# Patient Record
Sex: Female | Born: 1960 | Hispanic: Yes | Marital: Single | Smoking: Never smoker
Health system: Southern US, Community
[De-identification: ages and names within clinical notes are randomized; demographics above are authoritative.]

## PROBLEM LIST (undated history)

## (undated) DIAGNOSIS — D649 Anemia, unspecified: Secondary | ICD-10-CM

## (undated) HISTORY — PX: CHOLECYSTECTOMY: SHX55

## (undated) HISTORY — DX: Anemia, unspecified: D64.9

---

## 2011-02-11 ENCOUNTER — Other Ambulatory Visit: Payer: Self-pay | Admitting: Geriatric Medicine

## 2011-02-11 DIAGNOSIS — R102 Pelvic and perineal pain: Secondary | ICD-10-CM

## 2011-02-11 DIAGNOSIS — N83209 Unspecified ovarian cyst, unspecified side: Secondary | ICD-10-CM

## 2011-02-12 ENCOUNTER — Ambulatory Visit
Admission: RE | Admit: 2011-02-12 | Discharge: 2011-02-12 | Disposition: A | Discharge status: Home / Self Care | Payer: No Typology Code available for payment source | Source: Ambulatory Visit | Attending: Geriatric Medicine | Admitting: Geriatric Medicine

## 2011-02-12 DIAGNOSIS — R102 Pelvic and perineal pain: Secondary | ICD-10-CM

## 2011-02-12 DIAGNOSIS — N83209 Unspecified ovarian cyst, unspecified side: Secondary | ICD-10-CM

## 2011-02-21 ENCOUNTER — Ambulatory Visit: Payer: No Typology Code available for payment source | Admitting: Gynecology

## 2011-03-10 ENCOUNTER — Ambulatory Visit (INDEPENDENT_AMBULATORY_CARE_PROVIDER_SITE_OTHER): Payer: Self-pay | Admitting: Gynecology

## 2011-03-10 DIAGNOSIS — N949 Unspecified condition associated with female genital organs and menstrual cycle: Secondary | ICD-10-CM

## 2011-03-10 DIAGNOSIS — R1900 Intra-abdominal and pelvic swelling, mass and lump, unspecified site: Secondary | ICD-10-CM

## 2011-03-10 DIAGNOSIS — N83209 Unspecified ovarian cyst, unspecified side: Secondary | ICD-10-CM

## 2011-03-12 ENCOUNTER — Other Ambulatory Visit: Payer: Self-pay | Admitting: Gynecology

## 2011-03-12 DIAGNOSIS — R1900 Intra-abdominal and pelvic swelling, mass and lump, unspecified site: Secondary | ICD-10-CM

## 2011-03-19 ENCOUNTER — Ambulatory Visit
Admission: RE | Admit: 2011-03-19 | Discharge: 2011-03-19 | Disposition: A | Discharge status: Home / Self Care | Payer: No Typology Code available for payment source | Source: Ambulatory Visit | Attending: Gynecology | Admitting: Gynecology

## 2011-03-19 ENCOUNTER — Other Ambulatory Visit: Payer: Self-pay

## 2011-03-19 DIAGNOSIS — R1900 Intra-abdominal and pelvic swelling, mass and lump, unspecified site: Secondary | ICD-10-CM

## 2011-03-19 MED ORDER — GADOBENATE DIMEGLUMINE 529 MG/ML IV SOLN
15.0000 mL | Freq: Once | INTRAVENOUS | Status: AC | PRN
  Administered 2011-03-19: 15 mL via INTRAVENOUS

## 2011-03-24 ENCOUNTER — Encounter (HOSPITAL_COMMUNITY)
Admission: RE | Admit: 2011-03-24 | Discharge: 2011-03-24 | Disposition: A | Discharge status: Home / Self Care | Payer: Self-pay | Source: Ambulatory Visit | Attending: Gynecology | Admitting: Gynecology

## 2011-03-24 ENCOUNTER — Inpatient Hospital Stay (HOSPITAL_COMMUNITY): Admission: RE | Admit: 2011-03-24 | Payer: Self-pay | Source: Ambulatory Visit

## 2011-03-24 ENCOUNTER — Institutional Professional Consult (permissible substitution) (INDEPENDENT_AMBULATORY_CARE_PROVIDER_SITE_OTHER): Payer: Self-pay | Admitting: Gynecology

## 2011-03-24 ENCOUNTER — Encounter (HOSPITAL_COMMUNITY): Payer: Self-pay

## 2011-03-24 DIAGNOSIS — Z01818 Encounter for other preprocedural examination: Secondary | ICD-10-CM | POA: Insufficient documentation

## 2011-03-24 DIAGNOSIS — Z01812 Encounter for preprocedural laboratory examination: Secondary | ICD-10-CM | POA: Insufficient documentation

## 2011-03-24 DIAGNOSIS — R1900 Intra-abdominal and pelvic swelling, mass and lump, unspecified site: Secondary | ICD-10-CM

## 2011-03-24 LAB — COMPREHENSIVE METABOLIC PANEL
BUN: 11 mg/dL (ref 6–23)
CO2: 27 mEq/L (ref 19–32)
Calcium: 9.3 mg/dL (ref 8.4–10.5)
Creatinine, Ser: 0.56 mg/dL (ref 0.50–1.10)
GFR calc Af Amer: >60 mL/min (ref >60)
GFR calc non Af Amer: >60 mL/min (ref >60)
Glucose, Bld: 92 mg/dL (ref 70–99)

## 2011-03-24 LAB — URINALYSIS, ROUTINE W REFLEX MICROSCOPIC
Ketones, ur: NEGATIVE mg/dL
Leukocytes, UA: NEGATIVE
Nitrite: NEGATIVE
Protein, ur: NEGATIVE mg/dL

## 2011-03-24 LAB — CBC
HCT: 32.2 % — ABNORMAL LOW (ref 36.0–46.0)
Hemoglobin: 10.3 g/dL — ABNORMAL LOW (ref 12.0–15.0)
MCV: 62.2 fL — ABNORMAL LOW (ref 78.0–100.0)
RDW: 14.9 % (ref 11.5–15.5)
WBC: 8.4 10*3/uL (ref 4.0–10.5)

## 2011-03-24 LAB — SURGICAL PCR SCREEN: Staphylococcus aureus: NEGATIVE

## 2011-03-24 NOTE — Patient Instructions (Signed)
20 Margarete Hobday  03/24/2011   Your procedure is scheduled on:  03/31/11  Report to [facility] at 1115 AM.  Call this number if you have problems the morning of surgery: [phone]   Remember:   Do not eat food:After Midnight.  Do not drink clear liquids: 4 Hours before arrival.  Take these medicines the morning of surgery with A SIP OF WATERnone   Do not wear jewelry, make-up or nail polish.  Do not bring valuables to the hospital.  Contacts, dentures or bridgework may not be worn into surgery.  Leave suitcase in the car. After surgery it may be brought to your room.  For patients admitted to the hospital, checkout time is 11:00 AM the day of discharge.   Patients discharged the day of surgery will not be allowed to drive home.  Name and phone number of your driver: [phone]  Special Instructions: CHG Shower Shower 2 days before surgery and 1 day before surgery with Hibiclens.   Please read over the following fact sheets that you were given: Pain Booklet and Surgical Site Infection Prevention

## 2011-03-28 ENCOUNTER — Encounter (HOSPITAL_COMMUNITY): Payer: Self-pay | Admitting: Gynecology

## 2011-03-28 DIAGNOSIS — R1900 Intra-abdominal and pelvic swelling, mass and lump, unspecified site: Secondary | ICD-10-CM | POA: Diagnosis present

## 2011-03-28 NOTE — H&P (Addendum)
Kathleen Watson is an 50 y.o. female who had been referred to our office at [facility] from the Urgent Care facility as a result of abdominal pelvic pains. An ultrasound had demonstrated an anechoic cystic mass with no blood flow with a dimension of 12 .4x8.4x10.4 cm. CT scan confirmed. No pelvic lymphadenopathy and chest x-ray normal. Her CA 125 was normal as well.  Pertinent Gynecological History: Menses: post-menopausal Bleeding: none Contraception: none DES exposure: unknown Blood transfusions: none Sexually transmitted diseases: no past history Previous GYN Procedures: 3 cesections  Last mammogram: normal Date: unknown Last pap: normal Date: menopausal OB History:G3, P3   Menstrual History: Menarche age: 4 No LMP recorded.    Past Medical History  Diagnosis Date  . GERD (gastroesophageal reflux disease)     mild, occasional - no meds    Past Surgical History  Procedure Date  . Cesarean section     times three  . Cholecystectomy     History reviewed. No pertinent family history.  Social History:  reports that she has never smoked. She does not have any smokeless tobacco history on file. She reports that she does not drink alcohol or use illicit drugs.  Allergies: No Known Allergies  No prescriptions prior to admission    Review of Systems  Constitutional: Negative.   HENT: Negative.   Eyes: Negative.   Respiratory: Negative.   Cardiovascular: Negative.   Gastrointestinal: Positive for abdominal pain.  Genitourinary: Negative.   Musculoskeletal: Negative.   Skin: Negative.   Neurological: Negative.   Endo/Heme/Allergies: Negative.   Psychiatric/Behavioral: Negative.     There were no vitals taken for this visit. Physical Exam  Constitutional: She is oriented to person, place, and time. She appears well-developed and well-nourished.  HENT:  Head: Normocephalic and atraumatic.  Right Ear: External ear normal.  Left Ear: External ear normal.    Nose: Nose normal.  Mouth/Throat: Oropharynx is clear and moist.  Eyes: Conjunctivae and EOM are normal. Pupils are equal, round, and reactive to light.  Neck: Normal range of motion. Neck supple.  Cardiovascular: Normal rate and regular rhythm.   Respiratory: Effort normal and breath sounds normal.  GI: She exhibits mass. There is tenderness.       Mass can be felt up to 2 fingerbreadths underneath the umbilicus  Genitourinary:       On bimanual exam the mass appeared to occupy the abdomen and centrally located just to a few fingerbreadths from the umbilicus  Musculoskeletal: Normal range of motion.  Neurological: She is alert and oriented to person, place, and time. She has normal reflexes.  Skin: Skin is warm.  Psychiatric: She has a normal mood and affect.    No results found for this or any previous visit (from the past 24 hour(s)).  No results found.  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 Patient scheduled for an exploratory laparotomy with bilateral salping oophorectomy. Risk and benefits were discussed with the patient in spanish to include: infection (prohylactic antibiotics will be administered), trauma to internal organs (requiring corrective surgery), DVT (Pneumonocompression stockings will be used prophylacticaly), hemorrhage (risk of blood transfusion with its risk of AIDS,Hepatitis and anaphylactic reaction from donor blood and blood products). Patient  Also had been provided with literature on pelvic mass. Patient fully understands and all questions answered.  Ok Edwards 03/28/2011, 3:35 PM

## 2011-03-30 MED ORDER — DEXTROSE 5 % IV SOLN
1.0000 g | Freq: Once | INTRAVENOUS | Status: DC
  Filled 2011-03-30: qty 1

## 2011-03-31 ENCOUNTER — Inpatient Hospital Stay (HOSPITAL_COMMUNITY)
Admission: RE | Admit: 2011-03-31 | Discharge: 2011-04-02 | DRG: 743 | Disposition: A | Discharge status: Home / Self Care | Payer: Self-pay | Source: Ambulatory Visit | Attending: Gynecology | Admitting: Gynecology

## 2011-03-31 ENCOUNTER — Encounter (HOSPITAL_COMMUNITY): Payer: Self-pay | Admitting: Gynecology

## 2011-03-31 ENCOUNTER — Other Ambulatory Visit: Payer: Self-pay | Admitting: Gynecology

## 2011-03-31 ENCOUNTER — Encounter (HOSPITAL_COMMUNITY)
Admission: RE | Disposition: A | Discharge status: Home / Self Care | Payer: Self-pay | Source: Ambulatory Visit | Attending: Gynecology

## 2011-03-31 ENCOUNTER — Encounter (HOSPITAL_COMMUNITY): Payer: Self-pay | Admitting: Anesthesiology

## 2011-03-31 ENCOUNTER — Ambulatory Visit (HOSPITAL_COMMUNITY): Payer: Self-pay | Admitting: Anesthesiology

## 2011-03-31 ENCOUNTER — Ambulatory Visit (HOSPITAL_COMMUNITY)
Admission: RE | Admit: 2011-03-31 | Discharge: 2011-03-31 | Disposition: A | Discharge status: Home / Self Care | Payer: Self-pay | Source: Ambulatory Visit | Attending: Gynecology | Admitting: Gynecology

## 2011-03-31 DIAGNOSIS — D279 Benign neoplasm of unspecified ovary: Principal | ICD-10-CM | POA: Diagnosis present

## 2011-03-31 DIAGNOSIS — R1900 Intra-abdominal and pelvic swelling, mass and lump, unspecified site: Secondary | ICD-10-CM

## 2011-03-31 DIAGNOSIS — N83209 Unspecified ovarian cyst, unspecified side: Secondary | ICD-10-CM

## 2011-03-31 DIAGNOSIS — D649 Anemia, unspecified: Secondary | ICD-10-CM | POA: Diagnosis present

## 2011-03-31 DIAGNOSIS — N949 Unspecified condition associated with female genital organs and menstrual cycle: Secondary | ICD-10-CM

## 2011-03-31 HISTORY — PX: SALPINGOOPHORECTOMY: SHX82

## 2011-03-31 HISTORY — PX: LAPAROTOMY: SHX154

## 2011-03-31 HISTORY — PX: BILATERAL SALPINGOOPHORECTOMY: SHX1223

## 2011-03-31 SURGERY — LAPAROTOMY
Anesthesia: General | Site: Abdomen | Wound class: Clean Contaminated

## 2011-03-31 MED ORDER — BUPIVACAINE HCL 0.25 % IJ SOLN
INTRAMUSCULAR | Status: DC | PRN
  Administered 2011-03-31: 10 mL

## 2011-03-31 MED ORDER — LACTATED RINGERS IV SOLN
INTRAVENOUS | Status: DC
  Administered 2011-03-31 (×2): via INTRAVENOUS

## 2011-03-31 MED ORDER — MORPHINE SULFATE 10 MG/ML IJ SOLN
INTRAMUSCULAR | Status: AC
  Filled 2011-03-31: qty 1

## 2011-03-31 MED ORDER — ROCURONIUM BROMIDE 50 MG/5ML IV SOLN
INTRAVENOUS | Status: AC
  Filled 2011-03-31: qty 1

## 2011-03-31 MED ORDER — KETOROLAC TROMETHAMINE 30 MG/ML IJ SOLN
INTRAMUSCULAR | Status: DC | PRN
  Administered 2011-03-31: 30 mg via INTRAVENOUS

## 2011-03-31 MED ORDER — DEXAMETHASONE SODIUM PHOSPHATE 4 MG/ML IJ SOLN
INTRAMUSCULAR | Status: DC | PRN
  Administered 2011-03-31: 4 mg via INTRAVENOUS

## 2011-03-31 MED ORDER — SODIUM CHLORIDE 0.9 % IJ SOLN: 9.0000 mL | INTRAMUSCULAR | Status: DC | PRN

## 2011-03-31 MED ORDER — MIDAZOLAM HCL 2 MG/2ML IJ SOLN
INTRAMUSCULAR | Status: AC
  Filled 2011-03-31: qty 2

## 2011-03-31 MED ORDER — GLYCOPYRROLATE 0.2 MG/ML IJ SOLN
INTRAMUSCULAR | Status: DC | PRN
  Administered 2011-03-31: .5 mg via INTRAVENOUS

## 2011-03-31 MED ORDER — NEOSTIGMINE METHYLSULFATE 1 MG/ML IJ SOLN
INTRAMUSCULAR | Status: DC | PRN
  Administered 2011-03-31: 3 mg via INTRAVENOUS

## 2011-03-31 MED ORDER — MIDAZOLAM HCL 5 MG/5ML IJ SOLN
INTRAMUSCULAR | Status: DC | PRN
  Administered 2011-03-31: 2 mg via INTRAVENOUS

## 2011-03-31 MED ORDER — NALOXONE HCL 0.4 MG/ML IJ SOLN: 0.4000 mg | INTRAMUSCULAR | Status: DC | PRN

## 2011-03-31 MED ORDER — PROPOFOL 10 MG/ML IV EMUL
INTRAVENOUS | Status: DC | PRN
  Administered 2011-03-31: 200 mg via INTRAVENOUS
  Administered 2011-03-31: 40 mg via INTRAVENOUS

## 2011-03-31 MED ORDER — LIDOCAINE HCL (CARDIAC) 10 MG/ML IV SOLN
INTRAVENOUS | Status: DC | PRN
  Administered 2011-03-31: 60 mg via INTRAVENOUS

## 2011-03-31 MED ORDER — KETOROLAC TROMETHAMINE 30 MG/ML IJ SOLN
INTRAMUSCULAR | Status: AC
  Filled 2011-03-31: qty 1

## 2011-03-31 MED ORDER — DEXTROSE IN LACTATED RINGERS 5 % IV SOLN
INTRAVENOUS | Status: DC
  Administered 2011-03-31 – 2011-04-01 (×2): via INTRAVENOUS

## 2011-03-31 MED ORDER — MORPHINE SULFATE (PF) 1 MG/ML IV SOLN
INTRAVENOUS | Status: DC
  Administered 2011-03-31 (×2): 25 mg via INTRAVENOUS
  Filled 2011-03-31: qty 25

## 2011-03-31 MED ORDER — DEXTROSE 5 % IV SOLN
1.0000 g | INTRAVENOUS | Status: DC | PRN
  Administered 2011-03-31: 1 g via INTRAVENOUS

## 2011-03-31 MED ORDER — SODIUM CHLORIDE 0.9 % IR SOLN
Status: DC | PRN
  Administered 2011-03-31: 1000 mL

## 2011-03-31 MED ORDER — ROCURONIUM BROMIDE 100 MG/10ML IV SOLN
INTRAVENOUS | Status: DC | PRN
  Administered 2011-03-31: 35 mg via INTRAVENOUS

## 2011-03-31 MED ORDER — MORPHINE SULFATE 10 MG/ML IJ SOLN
INTRAMUSCULAR | Status: DC | PRN
  Administered 2011-03-31 (×2): 5 mg via INTRAVENOUS

## 2011-03-31 MED ORDER — ONDANSETRON HCL 4 MG/2ML IJ SOLN
4.0000 mg | Freq: Four times a day (QID) | INTRAMUSCULAR | Status: DC
  Administered 2011-03-31 – 2011-04-01 (×4): 4 mg via INTRAVENOUS
  Filled 2011-03-31 (×4): qty 2

## 2011-03-31 MED ORDER — FENTANYL CITRATE 0.05 MG/ML IJ SOLN
INTRAMUSCULAR | Status: DC | PRN
  Administered 2011-03-31: 50 ug via INTRAVENOUS
  Administered 2011-03-31: 100 ug via INTRAVENOUS
  Administered 2011-03-31 (×2): 50 ug via INTRAVENOUS

## 2011-03-31 MED ORDER — LACTATED RINGERS IV SOLN
INTRAVENOUS | Status: DC | PRN
  Administered 2011-03-31 (×2): via INTRAVENOUS

## 2011-03-31 MED ORDER — METOCLOPRAMIDE HCL 5 MG/ML IJ SOLN
10.0000 mg | Freq: Once | INTRAMUSCULAR | Status: AC | PRN
  Administered 2011-03-31: 10 mg via INTRAVENOUS

## 2011-03-31 MED ORDER — DIPHENHYDRAMINE HCL 50 MG/ML IJ SOLN: 25.0000 mg | Freq: Four times a day (QID) | INTRAMUSCULAR | Status: DC | PRN

## 2011-03-31 MED ORDER — MEPERIDINE HCL 25 MG/ML IJ SOLN: 6.2500 mg | INTRAMUSCULAR | Status: DC | PRN

## 2011-03-31 MED ORDER — HYDROMORPHONE HCL 1 MG/ML IJ SOLN
0.2500 mg | INTRAMUSCULAR | Status: DC | PRN
  Administered 2011-03-31: 0.25 mg via INTRAVENOUS
  Administered 2011-03-31: 0.5 mg via INTRAVENOUS

## 2011-03-31 MED ORDER — FENTANYL CITRATE 0.05 MG/ML IJ SOLN
INTRAMUSCULAR | Status: AC
  Filled 2011-03-31: qty 5

## 2011-03-31 MED ORDER — METOCLOPRAMIDE HCL 5 MG/ML IJ SOLN: 10.0000 mg | Freq: Four times a day (QID) | INTRAMUSCULAR | Status: DC

## 2011-03-31 MED ORDER — LIDOCAINE HCL (CARDIAC) 20 MG/ML IV SOLN
INTRAVENOUS | Status: AC
  Filled 2011-03-31: qty 5

## 2011-03-31 MED ORDER — ACETAMINOPHEN 325 MG PO TABS: 325.0000 mg | ORAL_TABLET | ORAL | Status: DC | PRN

## 2011-03-31 MED ORDER — HEPARIN SODIUM (PORCINE) 5000 UNIT/ML IJ SOLN
INTRAMUSCULAR | Status: DC | PRN
  Administered 2011-03-31: 5000 [IU]

## 2011-03-31 MED ORDER — ONDANSETRON HCL 4 MG/2ML IJ SOLN
INTRAMUSCULAR | Status: DC | PRN
  Administered 2011-03-31: 4 mg via INTRAMUSCULAR

## 2011-03-31 MED ORDER — PROPOFOL 10 MG/ML IV EMUL
INTRAVENOUS | Status: AC
  Filled 2011-03-31: qty 20

## 2011-03-31 SURGICAL SUPPLY — 12 items
CELLS DAT CNTRL 66122 CELL SVR (MISCELLANEOUS) IMPLANT
CLOTH BEACON ORANGE TIMEOUT ST (SAFETY) ×3 IMPLANT
GLOVE BIOGEL PI IND STRL 8 (GLOVE) ×2 IMPLANT
GLOVE BIOGEL PI INDICATOR 8 (GLOVE) ×1
GLOVE ECLIPSE 7.5 STRL STRAW (GLOVE) ×6 IMPLANT
PACK ABDOMINAL GYN (CUSTOM PROCEDURE TRAY) ×3 IMPLANT
RETRACTOR WND ALEXIS 25 LRG (MISCELLANEOUS) IMPLANT
RTRCTR WOUND ALEXIS 18CM MED (MISCELLANEOUS)
RTRCTR WOUND ALEXIS 25CM LRG (MISCELLANEOUS)
SPONGE LAP 18X18 X RAY DECT (DISPOSABLE) ×6 IMPLANT
SUT VICRYL #0 CTB 1 (SUTURE) ×6 IMPLANT
SUT VICRYL 0 TIES 12 18 (SUTURE) ×3 IMPLANT

## 2011-03-31 NOTE — Anesthesia Postprocedure Evaluation (Signed)
  Anesthesia Post-op Note  Patient: Kathleen Watson  Procedure(s) Performed:  LAPAROTOMY; BILATERAL SALPINGO OOPHORECTOMY  Patient Location: PACU  Anesthesia Type: General  Level of Consciousness: awake, alert  and oriented  Airway and Oxygen Therapy: Patient Spontanous Breathing and Patient connected to nasal cannula oxygen  Post-op Pain: none  Post-op Assessment: Post-op Vital signs reviewed and Patient's Cardiovascular Status Stable  Post-op Vital Signs: Reviewed and stable  Complications: No apparent anesthesia complications

## 2011-03-31 NOTE — H&P (Signed)
No change in patients past medical history in the past 24 hours to the best of my knowledge.

## 2011-03-31 NOTE — Op Note (Signed)
03/31/2011  1:54 PM  PATIENT:  Kathleen Watson  50 y.o. female  PRE-OPERATIVE DIAGNOSIS:  Pelvic mass  POST-OPERATIVE DIAGNOSIS:  same  PROCEDURE:  Procedure(s): LAPAROTOMY BILATERAL SALPINGO OOPHORECTOMY  SURGEON:  Surgeon(s): Ok Edwards, MD Dara Lords, MD  ASSISTANTS: Nadyne Coombes. Fontaine   ANESTHESIA:   general  PROCEDURE:the patient was taken to the operating room where she underwent successful general endotracheal anesthesia. PSA stockings were in place as well as a Foley catheter in an effort to monitor urinary output. The abdomen was prepped and draped in the usual sterile fashion. A Pfannenstiel skin incision was made from the skin down to the subcutaneous tissue. The rectus fascia was incised in transverse fashion. The peritoneal cavity was entered cautiously. The O'Connor-O'Sullivan retractors were then placed. Patient was placed in Trendelenburg position. Pelvic washings were obtained and submitted for cytological evaluation. The right ovarian mass was brought out through the incision intact.  It was a 12 cm smooth surface ovarian cyst. The right proximal tube and utero-ovarian ligament were clamped with a Heaney clamp as was the right infundibulopelvic ligament. The mass was excised and passed off the operative field. The right proximal infundibulopelvic ligament was free tied followed by a transfixation stitch of 0 Vicryl suture. The contralateral ovary was identified it appeared atrophic. The left utero-ovarian ligament and proximal tube were clamped. The left utero-ovarian ligament was identified. The posterior broad ligament was penetrated with the surgeon's finger and the left infundibulopelvic ligament was clamped cut and suture ligated with 0 Vicryl suture followed by a transfixation stitch. The left tube and ovary were passed off the operative field for histological evaluation. The pelvic cavity was copiously irrigated with normal saline solution. Sponge count  and needle count were correct. The visceral peritoneum was then closed with a running stitch of 3-0 Vicryl suture. The rectus fascia was closed with a running stitch of 0 Vicryl suture. The subcutaneous bleeders were Bovie cauterized. The skin was reapproximated with skin clips followed by placement of Xeroform gauze and 4 by a dressing. Cord percent Marcaine for a total 10 cc were infiltrated subcutaneously for postoperative analgesia. The patient was awakened and transferred to recovery room stable vital signs.  FINDINGS:a rightovarian cyst measuring approximately 12 cm with a smooth surface was found. With no excrescences. Left ovary atrophic. Some mild anterior abdominal wall pelvic adhesions were noted. Normal-appearing appendix.  DESCRIPTION OF OPERATION:  ESTIMATED BLOOD LOSS: * No blood loss amount entered *   Intake/Output Summary (Last 24 hours) at 03/31/11 1354 Last data filed at 03/31/11 1344  Gross per 24 hour  Intake   1000 ml  Output    550 ml  Net    450 ml     BLOOD ADMINISTERED:none   LOCAL MEDICATIONS USED:  MARCAINE  CC  SPECIMEN:  Source of Specimen:  Bilateral tubes and ovaries  DISPOSITION OF SPECIMEN:  PATHOLOGY  COUNTS:  YES  PLAN OF CARE: Transfer to PACU

## 2011-03-31 NOTE — Anesthesia Preprocedure Evaluation (Addendum)
Anesthesia Evaluation  Name, MR# and DOB Patient awake  General Assessment Comment  Reviewed: Allergy & Precautions, H&P , Patient's Chart, lab work & pertinent test results and reviewed documented beta blocker date and time   Airway Mallampati: II TM Distance: >3 FB Neck ROM: full    Dental No notable dental hx (+) Teeth Intact   Pulmonaryneg pulmonary ROS    clear to auscultation  pulmonary exam normal   Cardiovascular regular Normal   Neuro/PsychNegative Neurological ROS Negative Psych ROS  GI/Hepatic/Renal negative GI ROS, negative Liver ROS, and negative Renal ROS (+)       Endo/Other  Negative Endocrine ROS (+)   Abdominal   Musculoskeletal  Hematology negative hematology ROS (+)   Peds  Reproductive/Obstetrics negative OB ROS          Anesthesia Physical Anesthesia Plan  ASA: I  Anesthesia Plan: General   Post-op Pain Management:    Induction: Intravenous  Airway Management Planned: Oral ETT  Additional Equipment:   Intra-op Plan:   Post-operative Plan: Extubation in OR  Informed Consent: I have reviewed the patients History and Physical, chart, labs and discussed the procedure including the risks, benefits and alternatives for the proposed anesthesia with the patient or authorized representative who has indicated his/her understanding and acceptance.   Dental Advisory Given and Dental advisory given  Plan Discussed with: CRNA  Anesthesia Plan Comments:       Anesthesia Quick Evaluation

## 2011-04-01 LAB — CBC
HCT: 19.4 % — ABNORMAL LOW (ref 36.0–46.0)
Hemoglobin: 6.1 g/dL — CL (ref 12.0–15.0)
MCHC: 31.4 g/dL (ref 30.0–36.0)
RBC: 3.1 MIL/uL — ABNORMAL LOW (ref 3.87–5.11)

## 2011-04-01 LAB — COMPREHENSIVE METABOLIC PANEL
Alkaline Phosphatase: 42 U/L (ref 39–117)
BUN: 8 mg/dL (ref 6–23)
CO2: 30 mEq/L (ref 19–32)
Chloride: 104 mEq/L (ref 96–112)
GFR calc Af Amer: >60 mL/min (ref >60)
GFR calc non Af Amer: >60 mL/min (ref >60)
Glucose, Bld: 126 mg/dL — ABNORMAL HIGH (ref 70–99)
Potassium: 3.8 mEq/L (ref 3.5–5.1)
Total Bilirubin: 0.5 mg/dL (ref 0.3–1.2)

## 2011-04-01 LAB — PREPARE RBC (CROSSMATCH)

## 2011-04-01 LAB — ABO/RH: ABO/RH(D): A POS

## 2011-04-01 MED ORDER — ACETAMINOPHEN 160 MG/5ML PO SOLN
650.0000 mg | Freq: Four times a day (QID) | ORAL | Status: DC | PRN
  Filled 2011-04-01: qty 20.3

## 2011-04-01 MED ORDER — ACETAMINOPHEN 325 MG PO TABS
650.0000 mg | ORAL_TABLET | Freq: Once | ORAL | Status: AC
  Administered 2011-04-01: 650 mg via ORAL
  Filled 2011-04-01: qty 2

## 2011-04-01 MED ORDER — SODIUM CHLORIDE 0.9 % IV SOLN: INTRAVENOUS | Status: DC

## 2011-04-01 MED ORDER — SODIUM CHLORIDE 0.9 % IV SOLN: 250.0000 mL | INTRAVENOUS | Status: DC

## 2011-04-01 MED ORDER — SODIUM CHLORIDE 0.9 % IV SOLN
500.0000 mL | Freq: Once | INTRAVENOUS | Status: AC
  Administered 2011-04-01: 500 mL via INTRAVENOUS

## 2011-04-01 MED ORDER — OXYCODONE-ACETAMINOPHEN 5-325 MG PO TABS
1.0000 | ORAL_TABLET | ORAL | Status: DC | PRN
  Administered 2011-04-01 (×2): 2 via ORAL
  Administered 2011-04-02: 1 via ORAL
  Filled 2011-04-01 (×2): qty 2
  Filled 2011-04-01: qty 1

## 2011-04-01 MED ORDER — SODIUM CHLORIDE 0.9 % IJ SOLN
3.0000 mL | Freq: Two times a day (BID) | INTRAMUSCULAR | Status: DC
  Administered 2011-04-01: 3 mL via INTRAVENOUS

## 2011-04-01 MED ORDER — SODIUM CHLORIDE 0.9 % IJ SOLN: 3.0000 mL | INTRAMUSCULAR | Status: DC | PRN

## 2011-04-01 MED ORDER — DIPHENHYDRAMINE HCL 50 MG/ML IJ SOLN
25.0000 mg | Freq: Once | INTRAMUSCULAR | Status: AC
  Administered 2011-04-01: 25 mg via INTRAVENOUS
  Filled 2011-04-01: qty 1

## 2011-04-01 NOTE — Progress Notes (Signed)
Post Operative Daily Note  1 Day Post-Op Procedure(s) (LRB): LAPAROTOMY (N/A) BILATERAL SALPINGO OOPHORECTOMY (Bilateral)  Subjective: Patient reports tolerating PO.    Objective: I have reviewed patient's vital signs.  vital signs, intake and output, medications and labs.  EXAM General: alert, cooperative and fatigued Resp: clear to auscultation bilaterally Cardio: regular rate and rhythm, S1, S2 normal, no murmur, click, rub or gallop GI: hypoactive bowel sounds Extremities: extremities normal, atraumatic, no cyanosis or edema Vaginal Bleeding: none  Assessment: s/p Procedure(s): LAPAROTOMY BILATERAL SALPINGO OOPHORECTOMY: anemia This morning on morning rounds it was brought to my attention that the patient's hemoglobin was 6.1. Patient appear to be hypotensive with a blood pressure 95/58 her pulse is 72 and her temperature was 99.3 she was asymptomatic and she had ambulated, still had a Foley catheter her total urine output she was a head 3 L an average urine output 50 cc per hour. Her hemoglobin was repeated 2 hours later and was 6.1 stable her orthostatic blood pressure readings were in the hypotensive range but she was not tachycardic. She did state that going to the restroom she felt a little lightheaded. On questioning she said that she has always had problems with anemia but has never had a workup. She denied any hematochezia. Has never had a colonoscopy. And is postmenopausal and is not heavy vaginal bleeding problems. Her EBL during her surgery was less than 50 cc and her abdomen was dry upon completion . Because of her age and her symptoms will go ahead and transfuse her with 2 units of packed red blood cells. Her abdomen was soft and nontender, hypoactive and incision site was intact she had no CVA tenderness. She does tend to have a quiet affect of her nature. The risks benefits and pros and cons of a blood transfusion were discussed with the patient to include potential  risk for HIV, hepatitis and  anaphylactic reaction from donor blood to recipient. Patient's preop hemoglobin was 10 g before the start of her operation and had been drawn several days before her surgery. It appears that her hemoglobin is stable but based on her age will go ahead and transfuse 2 units PRBC. We'll continue to monitor closely. We'll DC her PCA pump start her on po medications and start on clear liquid diet and advance as tolerated. Plan: Transfuse 2 units PRBC Advance to PO medication Clear liquids  LOS: 1 day    Ok Edwards, MD 04/01/2011 12:48 PM    04/01/2011, 12:48 PM

## 2011-04-01 NOTE — Progress Notes (Signed)
This is Dr. Brayton Layman and dictating evening rounds. Patient received her second unit of packed red blood cells. Patient tolerated the transfusion without any reaction. Patient has been up and ambulating has passed gas and voiding well. She has tolerated liquid and regular diet this evening.  pretransfusion hemoglobin and hematocrit: 6.1 and 19.4 respectively with a platelet count 197,000 post transfusion hemoglobin hematocrit 8.1 and 25.4 respectively with a platelet count 191,000.  Vital signs: Temperature 97.6, 73, blood pressure 100/64, O2 sats on room at 96%. Go to sleep  Physical exam: Lungs clear to auscultation Heart: Regular rate and rhythm without any gallops Abdomen: Soft and nontender without any rebound or guarding. Incision intact. Vaginal: No bleeding. Extremities: No cords or edema  Plan: Hep-Lock IV. Continues regular diet. The ambulate and shower. Continue on Percocet by mouth when necessary. Plan discharge home tomorrow. We'll remove staples later in the week in the office.

## 2011-04-01 NOTE — Progress Notes (Signed)
Results for [name], [name] (MRN [id number]) as of 04/01/2011 08:05  Ref. Range 04/01/2011 07:40  HGB Latest Range: 12.0-15.0 g/dL 6.1 (LL)   Dr Lily Peer on floor and notified of above value.

## 2011-04-01 NOTE — Progress Notes (Signed)
04/01/2011  Kathleen Watson  Interpreter   I assisted Raynelle Fanning RN with consent for Blood  Transfusion and plan of care

## 2011-04-01 NOTE — Progress Notes (Signed)
UR Chart review completed.  

## 2011-04-02 ENCOUNTER — Encounter (HOSPITAL_COMMUNITY): Payer: Self-pay | Admitting: *Deleted

## 2011-04-02 LAB — CBC
MCV: 62.5 fL — ABNORMAL LOW (ref 78.0–100.0)
MCV: 69.6 fL — ABNORMAL LOW (ref 78.0–100.0)
Platelets: 224 10*3/uL (ref 150–400)
Platelets: 191 10*3/uL (ref 150–400)
RBC: 3.28 MIL/uL — ABNORMAL LOW (ref 3.87–5.11)
RBC: 3.65 MIL/uL — ABNORMAL LOW (ref 3.87–5.11)
RDW: 22.8 % — ABNORMAL HIGH (ref 11.5–15.5)
WBC: 8.9 10*3/uL (ref 4.0–10.5)
WBC: 7.1 10*3/uL (ref 4.0–10.5)

## 2011-04-02 LAB — TYPE AND SCREEN: Unit division: 0

## 2011-04-02 MED ORDER — METOCLOPRAMIDE HCL 10 MG PO TABS: 10.0000 mg | ORAL_TABLET | Freq: Three times a day (TID) | ORAL | Status: DC | PRN

## 2011-04-02 MED ORDER — OXYCODONE-ACETAMINOPHEN 5-325 MG PO TABS: 1.0000 | ORAL_TABLET | ORAL | Status: AC | PRN

## 2011-04-02 NOTE — Discharge Summary (Signed)
Physician Discharge Summary  Patient IDFlorinda Watson MRN: [id number] DOB/AGE: 1961-01-30 50 y.o.  Admit date: 03/31/2011 Discharge date: 04/02/2011  Admission Diagnoses: Pelvic mass and anemia   Discharge Diagnoses:  Active Problems:  Anemia   Hospital Course:patient underwent an exploratory laparotomy with bilateral salpingo-oophorectomy and pelvic washings onJuly 9.Patient had minimal blood loss intraoperatively.patient's preoperative hemoglobin and hematocrit were 10.3 and 32.2 respectively with a platelet count of 334,000. Her first postoperative day 1 her hemoglobin and hematocrit were 6.1 and 19.4 with platelet count of 197,000. Patient had  orthostatic readings. She was transfused 2 units of packed red blood cell. Her postoperative hemoglobin and hematocrit 8.1 and 25.4 respectively with a platelet count 191,000.Patient tolerated the transfusion well she was up and ambulating and tolerating regular diet well and was voiding well. Patient has a history of chronic anemia which has never been worked up. There's question whether the patient's outpatient hemoglobin was correct. Nevertheless she'll be sent home with supplemental iron and will proceed with her workup after she recovers from her surgery. 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  Filed Vitals:   04/02/11 1210  BP: 109/66  Pulse: 74  Temp: 98.2 F (36.8 C)  Resp: 18     I/O this shift: In: -  Out: 725 [Urine:725]  Discharge Exam: Blood pressure 109/66, pulse 74, temperature 98.2 F (36.8 C), temperature source Oral, resp. rate 18, SpO2 94.00%.   Consults: none  Significant Diagnostic Studies: labs: cbc  Treatments: transfusion 2 units of PRBC  Disposition: Final discharge disposition not confirmed  Discharge Orders    Future Appointments: Provider: Department: Dept Phone:  Center:   04/04/2011 10:10 AM Harrington Challenger, NP [facility] [phone] [facility]   04/14/2011 10:40 AM Ok Edwards, MD [facility] [phone] [facility]     Future Orders Please Complete By Expires   Diet general      Discharge instructions      Comments:   Patient scheduled to have staples removed this Friday at 10:10 at [facility] with Maryelizabeth Rowan NP   Increase activity slowly      Driving Restrictions      Comments:   1 week   Lifting restrictions      Comments:   6 weeks   Sexual Activity Restrictions      Comments:   6 weeks   Call MD for:  temperature >100.4      Call MD for:  persistant nausea and vomiting      Call MD for:  severe uncontrolled pain      Call MD for:  redness, tenderness, or signs of infection (pain, swelling, redness, odor or green/yellow discharge around incision site)      Call MD for:  difficulty breathing, headache or visual disturbances      Call MD for:  hives      Call MD for:  persistant dizziness or light-headedness          Current Discharge Medication List    START taking these medications   Details  metoCLOPramide (REGLAN) 10 MG tablet Take 1 tablet (10 mg total) by mouth 3 (three) times daily as needed. Qty: 30 tablet, Refills: 1    oxyCODONE-acetaminophen (PERCOCET) 5-325 MG per tablet Take 1-2 tablets by mouth every 4 (four) hours as needed. Qty: 30 tablet, Refills: 0      CONTINUE these medications which have NOT  CHANGED   Details  Omega-3 Fatty Acids (OMEGA 3 PO) Take 1 tablet by mouth daily.      PRESCRIPTION MEDICATION Take 1 tablet by mouth daily. Patient takes prescription iron. [facility] had no record.       STOP taking these medications     aspirin 81 MG tablet          Follow-up Information    Follow up with Harrington Challenger, NP. (appointment 10:10 AM)    Contact information:   [address] [place] [address] [phone]           Signed: Ok Edwards 04/02/2011, 3:37 PM

## 2011-04-02 NOTE — Transfer of Care (Signed)
Immediate Anesthesia Transfer of Care Note  Patient: Kathleen Watson  Procedure(s) Performed:  LAPAROTOMY; BILATERAL SALPINGO OOPHORECTOMY  Patient Location: PACU  Anesthesia Type: General  Level of Consciousness: patient cooperative  Airway & Oxygen Therapy: Patient Spontanous Breathing and Patient connected to nasal cannula oxygen  Post-op Assessment: Report given to PACU RN and Post -op Vital signs reviewed and stable  Post vital signs: Reviewed and stable  Complications: No apparent anesthesia complications

## 2011-04-02 NOTE — Progress Notes (Signed)
04/02/2011 Kathleen Watson  Interpreter   I assisted [name] RN with plan of care for today

## 2011-04-02 NOTE — Progress Notes (Signed)
Post Operative Daily Note  2 Days Post-Op Procedure(s) (LRB): LAPAROTOMY (N/A) BILATERAL SALPINGO OOPHORECTOMY (Bilateral)  Subjective: Patient reports mild headache this morning shortly after receiving her Percocet.     Objective: I have reviewed patient's vital signs.  vital signs, intake and output, medications and labs.temperature this morning 98.5 pulse 94 blood pressure 110/66.  EXAM General: alert, cooperative and no distress Resp: clear to auscultation bilaterally Cardio: regular rate and rhythm, S1, S2 normal, no murmur, click, rub or gallop GI: soft, non-tender; bowel sounds normal; no masses,  no organomegaly Extremities: extremities normal, atraumatic, no cyanosis or edema Vaginal Bleeding: none  Assessment: s/p Procedure(s): LAPAROTOMY BILATERAL SALPINGO OOPHORECTOMY: progressing well and tolerating diet  Plan: Advance diet Encourage ambulation Discharge home later today  LOS: 2 days    Ok Edwards, MD 04/02/2011 7:33 AM    04/02/2011, 7:33 AM

## 2011-04-04 ENCOUNTER — Ambulatory Visit (INDEPENDENT_AMBULATORY_CARE_PROVIDER_SITE_OTHER): Payer: Self-pay | Admitting: Women's Health

## 2011-04-04 DIAGNOSIS — Z9889 Other specified postprocedural states: Secondary | ICD-10-CM

## 2011-04-07 ENCOUNTER — Other Ambulatory Visit: Payer: Self-pay | Admitting: Gynecology

## 2011-04-07 DIAGNOSIS — Z1231 Encounter for screening mammogram for malignant neoplasm of breast: Secondary | ICD-10-CM

## 2011-04-14 ENCOUNTER — Encounter: Payer: Self-pay | Admitting: Gynecology

## 2011-04-14 ENCOUNTER — Ambulatory Visit (INDEPENDENT_AMBULATORY_CARE_PROVIDER_SITE_OTHER): Payer: Self-pay | Admitting: Gynecology

## 2011-04-14 VITALS — BP 118/72

## 2011-04-14 DIAGNOSIS — D279 Benign neoplasm of unspecified ovary: Secondary | ICD-10-CM

## 2011-04-14 NOTE — Progress Notes (Signed)
Patient presented to the office for three-week postoperative visit. She is status post exploratory laparotomy with bilateral salpingo-oophorectomy. Pathology report demonstrated A. Benign right ovarian serous cystadenoma the contralateral ovary was benign and pelvic washings were benign as well. Patient doing well postoperatively her incision site was intact. She is scheduled to return back in 3 weeks for final postop visit. Her bimanual examination whether otherwise unremarkable.

## 2011-04-14 NOTE — Patient Instructions (Signed)
Indicaciones postquirrgicas generales (Adultos) (Postsurgical Instructions, General, Adult) Puede sentirse mareado, dbil y somnoliento en las 24 horas posteriores a la anestesia. La siguiente informacin es vlida para el perodo de Audiological scientist las primeras 24 horas luego de la [place].  No conduzca automviles ni bicicletas, no participe en actividades en las que pueda lastimarse, no tome transportes pblicos hasta que suspenda los analgsicos opiceos para Engineer, materials y Engineer, manufacturing el consentimiento del profesional que lo asiste.   No beba alcohol, no tome tranquilizantes o medicamentos que no hayan sido prescritos o permitidos por el cirujano.   No tome medicamentos que no hayan sido recetados por el profesional que lo asiste.   No firme papeles importantes o contratos por al [employer] durante 24 horas o [address] se encuentre bajo los efectos de los medicamentos para Chief Technology Officer.   Haga que una persona responsable lo acompae.  CUIDADOS DE LA HERIDA:  Cambie el vendaje cuando lo necesite o cuando se le haya indicado.   Es preferible una ducha a un bao de inmersin; sin embargo, consltelo con el profesional que lo asiste si tiene tubos insertados en la herida.   Evite levantar objetos pesados (ms de 4 kilos [10 libra]), empujarlos o jalar.   Evite las actividades que puedan poner daar la incisin (el corte realizado por el cirujano).   Solo tome medicamentos de venta libre o recetados para Chief Technology Officer, Environmental health practitioner o la [place], segn le haya indicado el mdico. No tome aspirina. Puede provocar sangrado. Tome los medicamentos que destruyen grmenes (antibiticos) segn le hayan indicado.  BUSQUE ATENCIN MDICA SI:  Siente malestar estomacal (nuseas).   Comienza a vomitar.   No puede comer o beber.   Usted tiene una temperatura oral de ms de 100.   Tiene estreimiento y no obtuvo alivio al [employer] la dieta o al aumentar la ingestin de lquidos. Los medicamentos para el  dolor son Neomia Dear causa frecuente de estreimiento.  SOLICITE ATENCIN MDICA DE INMEDIATO SI:  Siente un mareo persistente.   Tiene dificultad para respirar o tos hmeda (congestiva).   Usted tiene una temperatura oral de ms de 100 y no puede controlarla con medicamentos.   Aumenta el dolor o la sensibilidad cerca de la incisin.  Document Released: 06/18/2005 Document Re-Released: 02/26/2010 [facility] Patient Information 2011 [place], [place].

## 2011-04-15 ENCOUNTER — Encounter (HOSPITAL_COMMUNITY): Payer: Self-pay | Admitting: Gynecology

## 2011-04-18 ENCOUNTER — Ambulatory Visit (HOSPITAL_COMMUNITY)
Admission: RE | Admit: 2011-04-18 | Discharge: 2011-04-18 | Disposition: A | Discharge status: Home / Self Care | Payer: Self-pay | Source: Ambulatory Visit | Attending: Gynecology | Admitting: Gynecology

## 2011-04-18 DIAGNOSIS — Z1231 Encounter for screening mammogram for malignant neoplasm of breast: Secondary | ICD-10-CM

## 2011-05-05 ENCOUNTER — Encounter: Payer: Self-pay | Admitting: Gynecology

## 2011-05-05 ENCOUNTER — Ambulatory Visit (INDEPENDENT_AMBULATORY_CARE_PROVIDER_SITE_OTHER): Payer: Self-pay | Admitting: Gynecology

## 2011-05-05 VITALS — BP 130/78

## 2011-05-05 DIAGNOSIS — Z9889 Other specified postprocedural states: Secondary | ICD-10-CM

## 2011-05-05 DIAGNOSIS — N83209 Unspecified ovarian cyst, unspecified side: Secondary | ICD-10-CM

## 2011-05-05 NOTE — Progress Notes (Signed)
Patient is a 50 year old gravida 3 para 3 who presented to the office today for her final postop visit. Patient is status post exploratory laparotomy with bilateral salpingo-oophorectomy secondary to right pelvic mass. Pathology report read as follows: Left fallopian tube and ovary unremarkable, right fallopian tube and ovary: Benign serous cystadenoma benign fallopian tube. Patient is asymptomatic.  Exam: Abdomen: Pfannenstiel incision intact, no rebound or guarding Bartholin urethra Skene glands: Within normal limits Vagina: No lesions or discharge Cervix: No lesions or discharge Uterus: Retroverted normal size shape and consistency with no palpable adnexal masses  Assessment: 6 weeks postop status post exploratory laparotomy with left salpingo-oophorectomy a right adnexal mass pathology report demonstrated benign serous cystadenoma. Patient is doing well may resume 4 normal activity her mammogram was done July of this year which was normal. She was instructed to continue her monthly self breast examination. She needs a screening colonoscopy. I have given her Hemoccult cards specimen to the office for testing. Instructions were provided in Spanish and we'll follow accordingly. Otherwise she'll return back in one year for her annual exam or when necessary.

## 2011-05-05 NOTE — Patient Instructions (Addendum)
Cita con Dr. Lily Peer en un ano para examen annual. Mandar las tarjeticas con Lynder Parents de escreta para analizar a nuestra oficina.

## 2011-07-22 IMAGING — US US TRANSVAGINAL NON-OB
1 series · 14 of 25 positions shown · non-contrast
Comparison: None.

CLINICAL DATA: Abdominal pain, pelvic pain

TRANSABDOMINAL ULTRASOUND OF PELVIS
TECHNIQUE: Transabdominal ultrasound examination of the pelvis was
performed including evaluation of the uterus, ovaries, adnexal
regions, and pelvic cul-de-sac.

[Series 1: us transvaginal non-ob · 0.24mm/px · 14 of 58 slices shown]
[im 1/58]
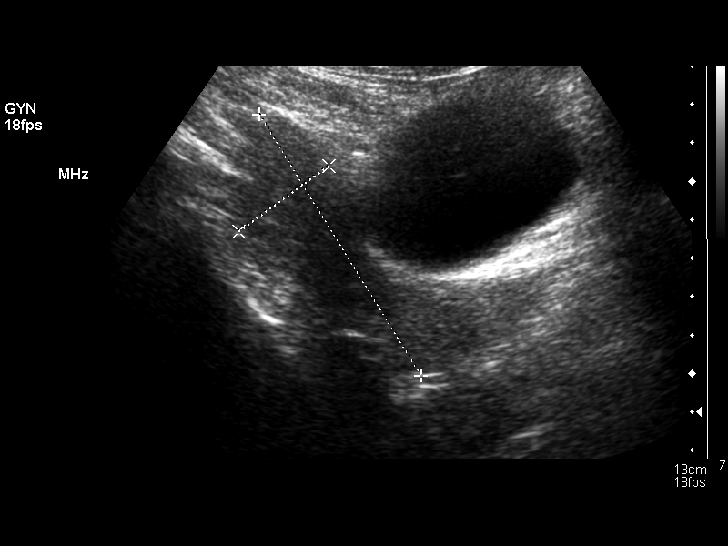
[im 5/58]
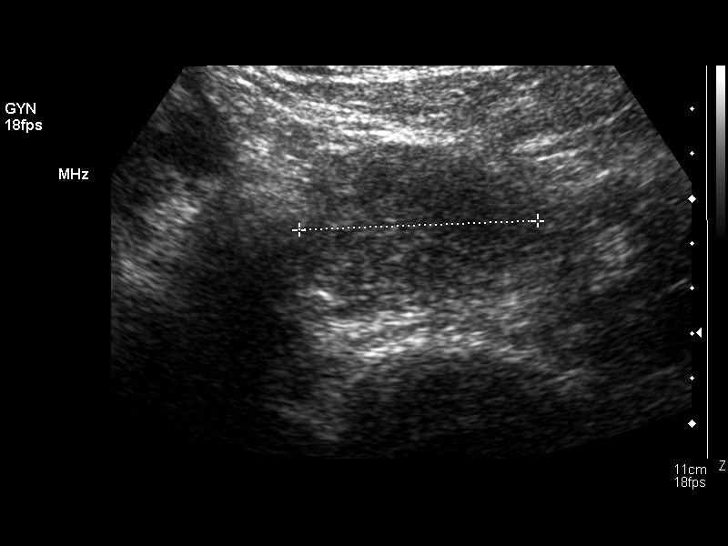
[im 10/58]
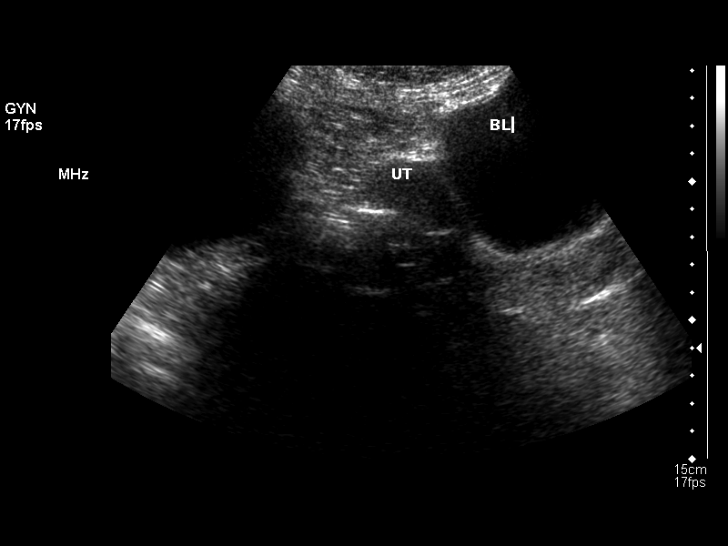
[im 15/58]
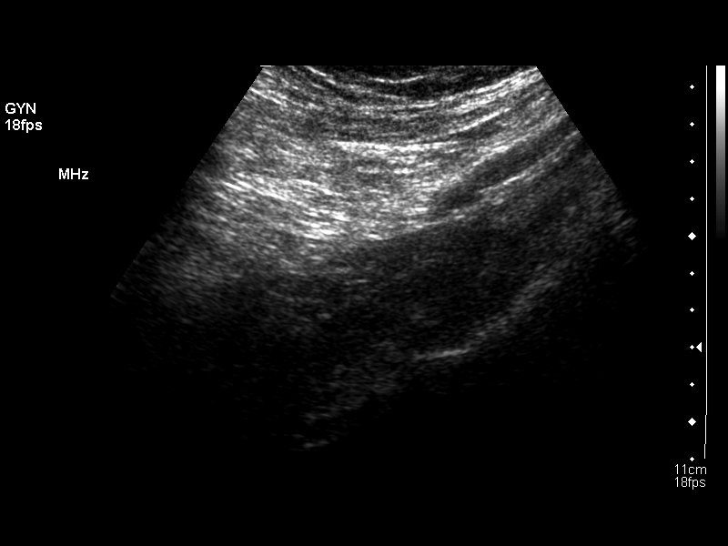
[im 20/58]
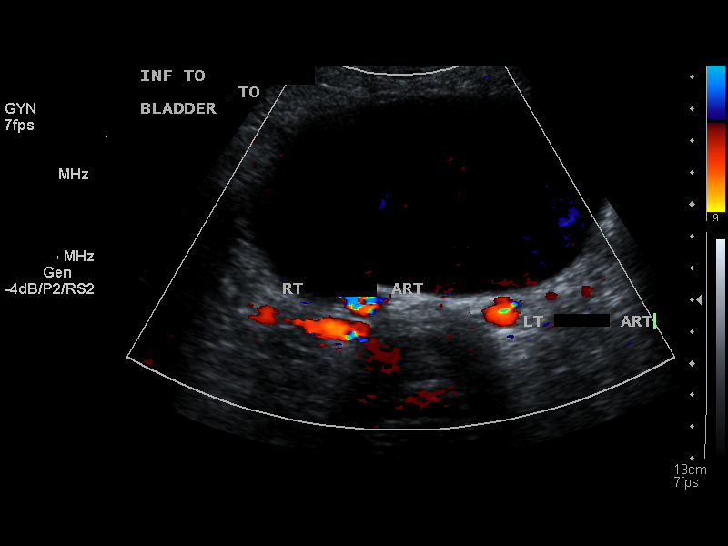
[im 22/58]
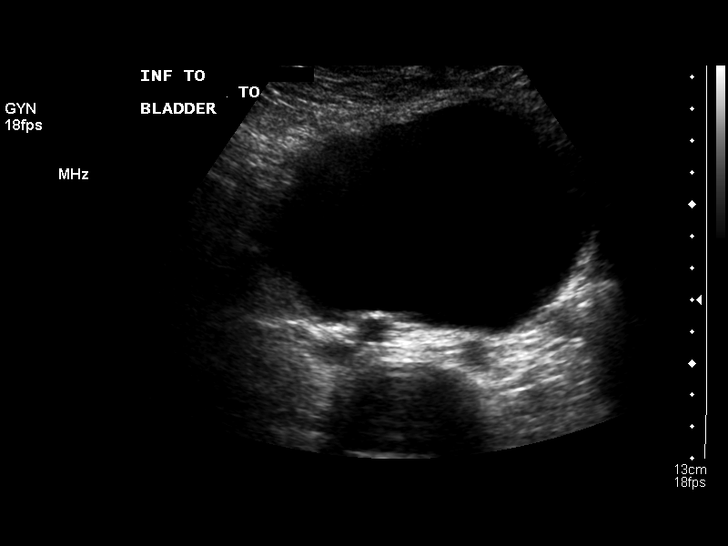
[im 27/58]
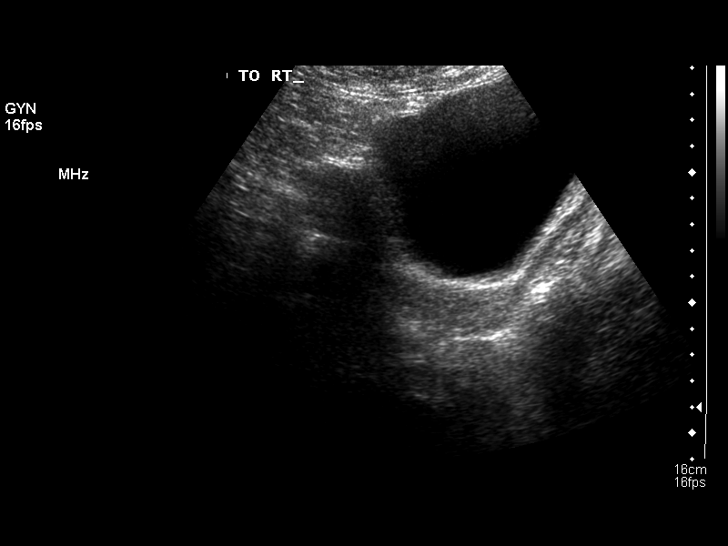
[im 31/58]
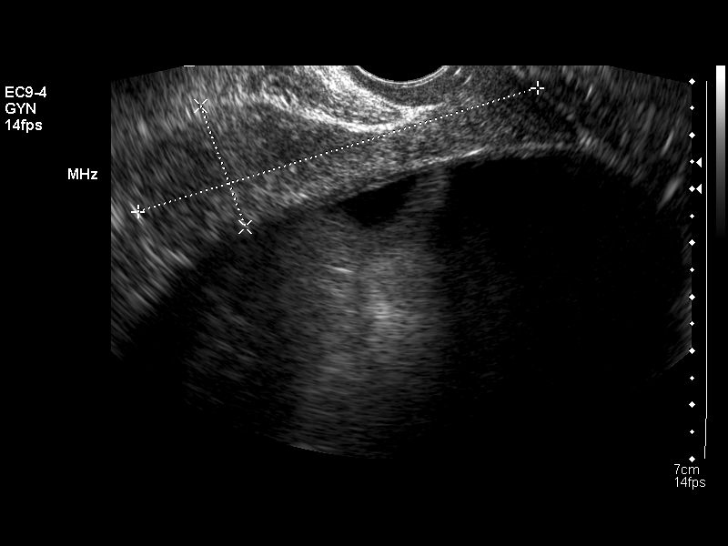
[im 36/58]
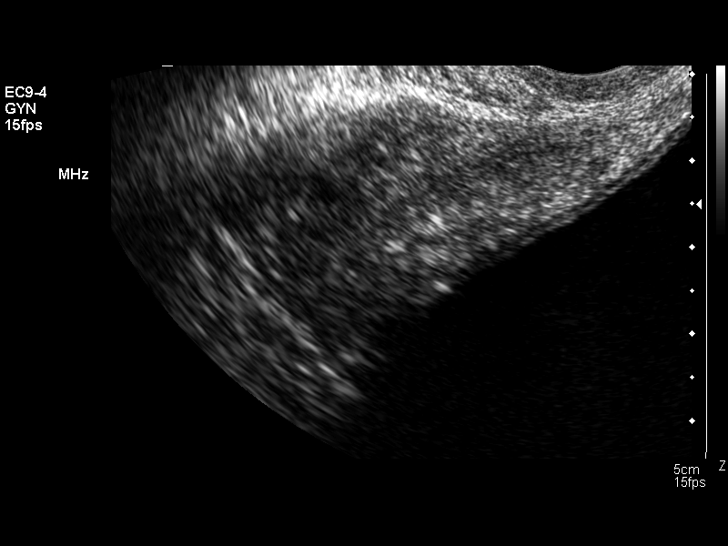
[im 39/58]
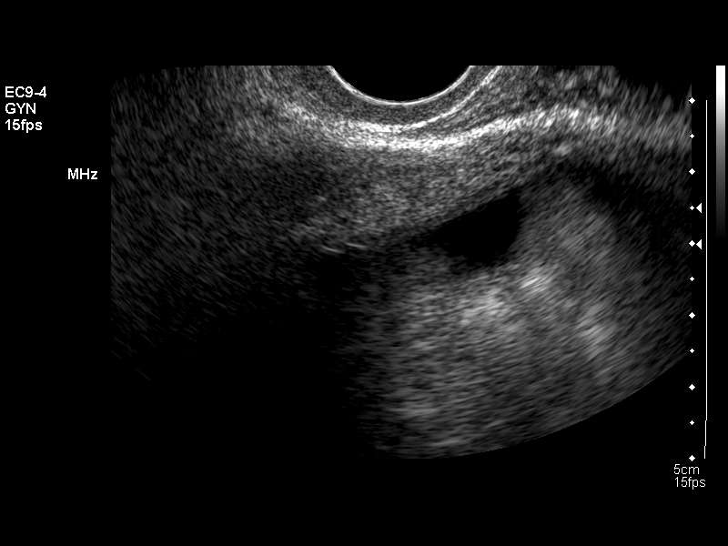
[im 43/58]
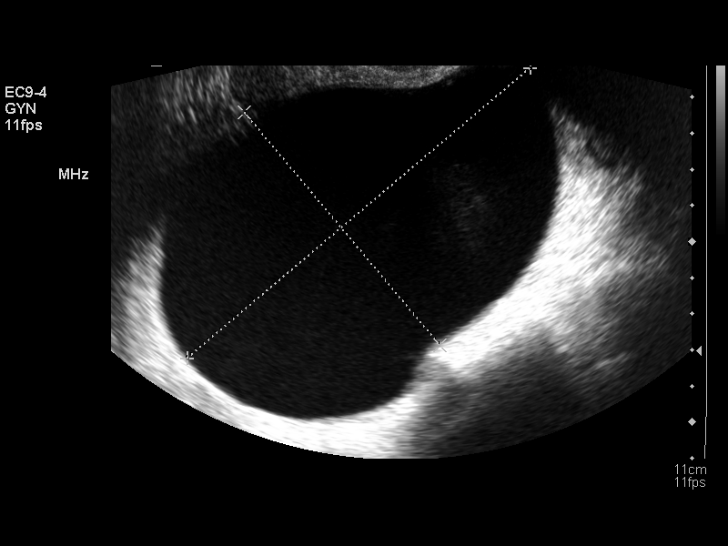
[im 48/58]
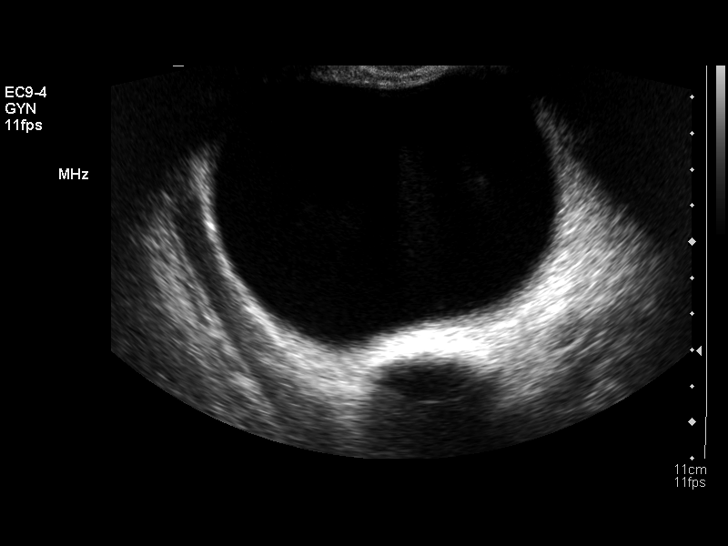
[im 53/58]
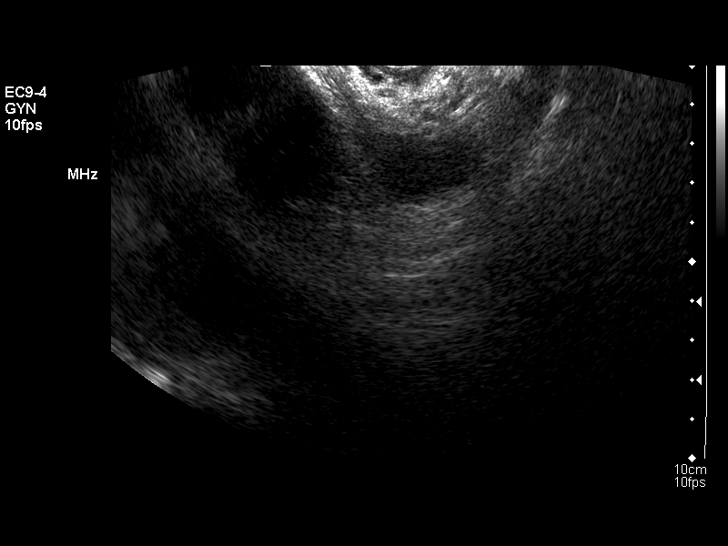
[im 58/58]
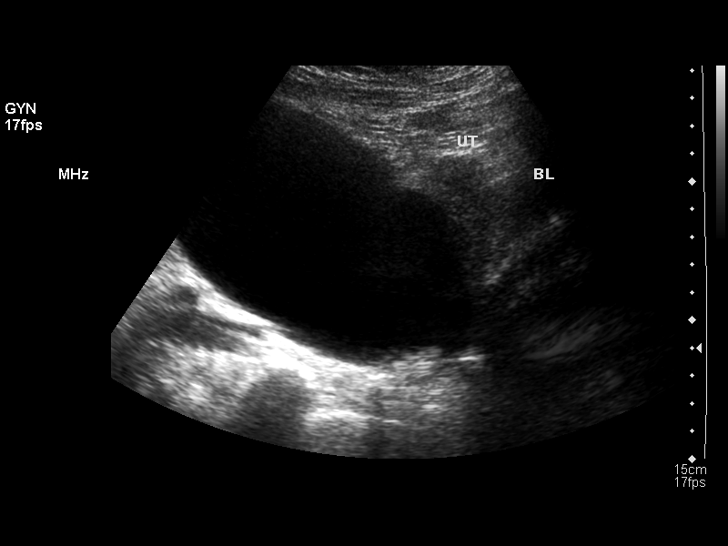

[14 of 25 positions shown; findings below may reference images not displayed]

FINDINGS: Uterus normal in size measuring 8.0 x 2.9 x 5.3 cm.  The a uterus
is deviated rightward.

Endometrium normal wall and thickness at 2 mm.

Right Ovary not visualized

Left Ovary not visualized

Other Findings:  There is a large anechoic cystic lesion extending
from the region of the umbilicus to the posterior aspect of the
uterus.  The lesion measures 12.4 x 8.4 x 10.4 cm.  No flow is
demonstrated in this lesion.  No septations.
IMPRESSION: Large 12 cm cystic lesion posterior to the uterus has no specific
worrisome characteristics. Cannot exclude serous cystadenoma.
Recommend GYN consultation for potential extension. An MRI with
contrast could add further characterization.

## 2011-08-26 IMAGING — CR DG CHEST 2V
2 series · 2 of 2 positions shown · non-contrast
Comparison: None

CLINICAL DATA: Preop for pelvic surgery.  Chest pain.

CHEST - 2 VIEW

[w chest pa]
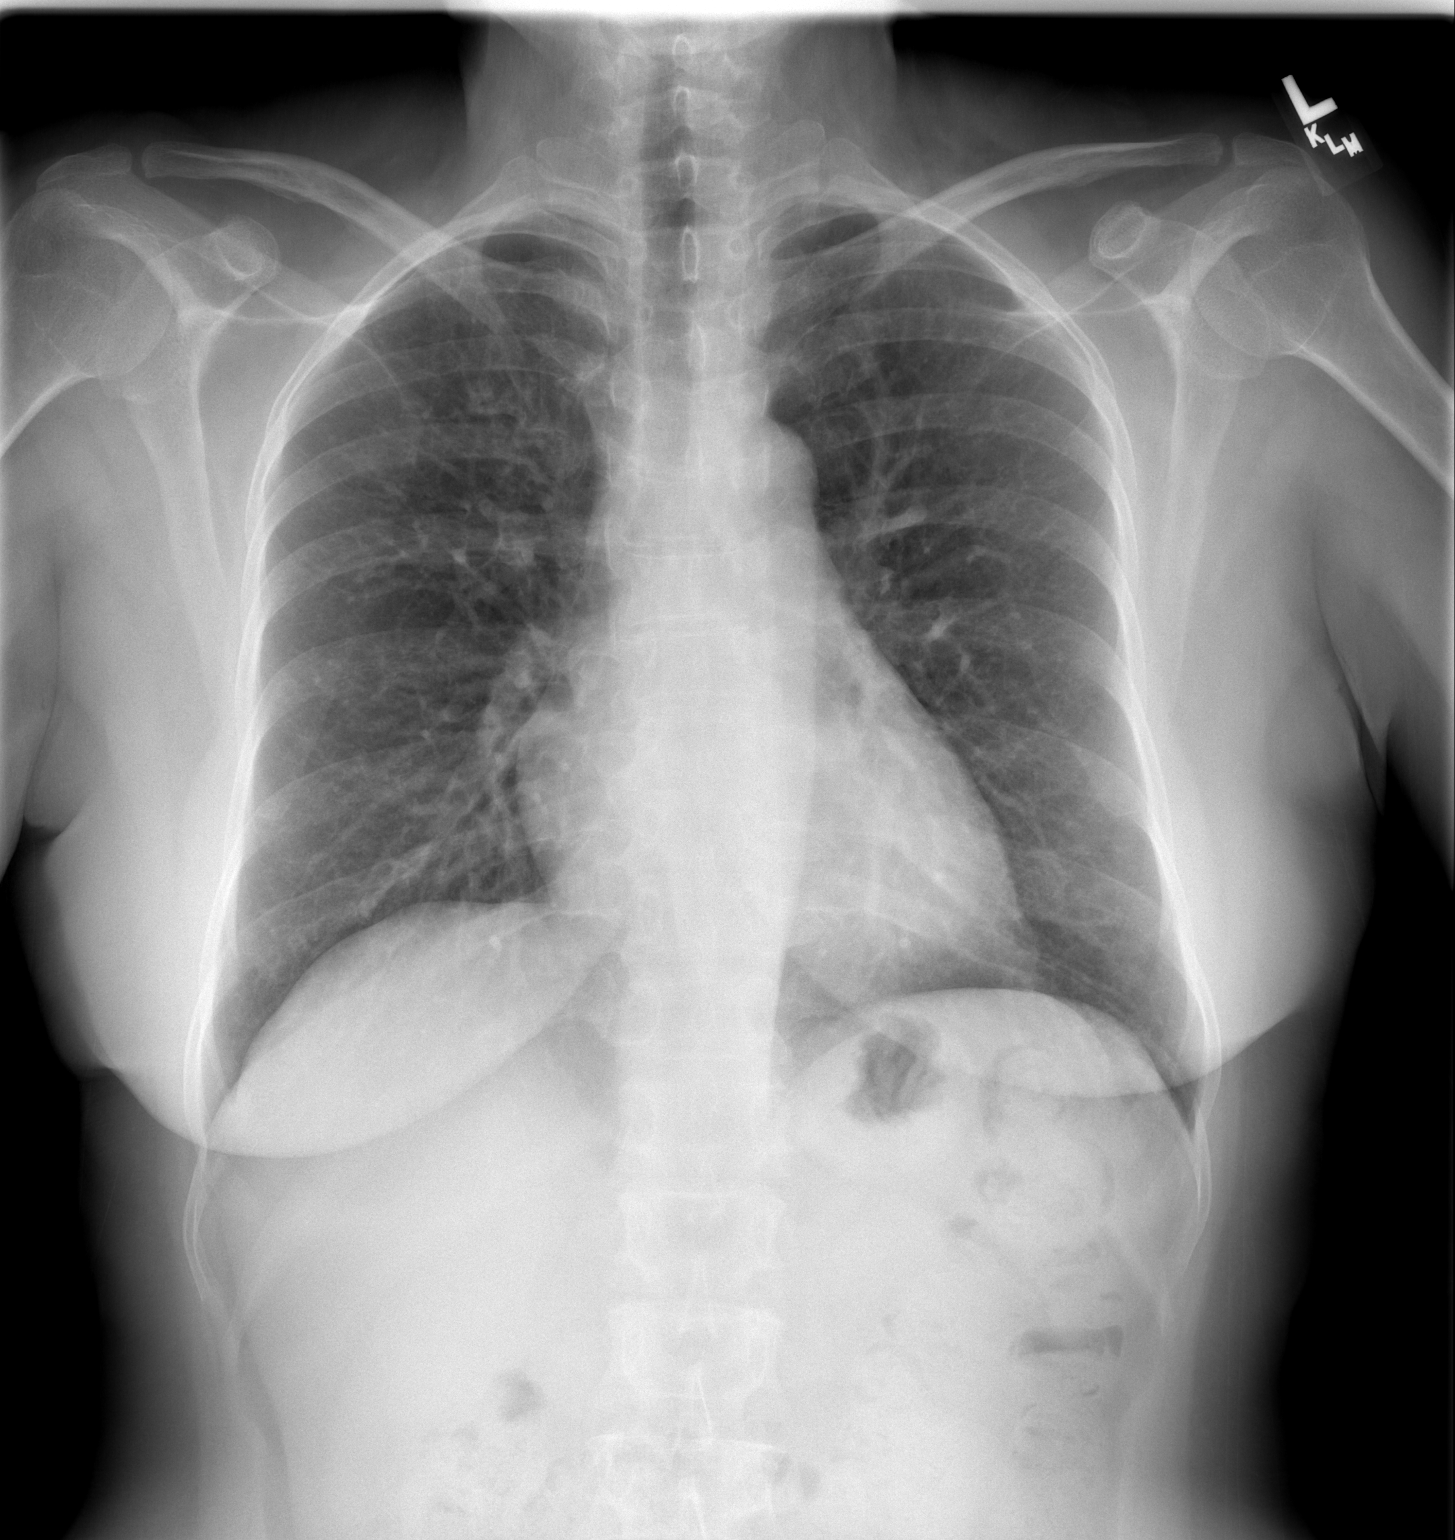

[w chest lat]
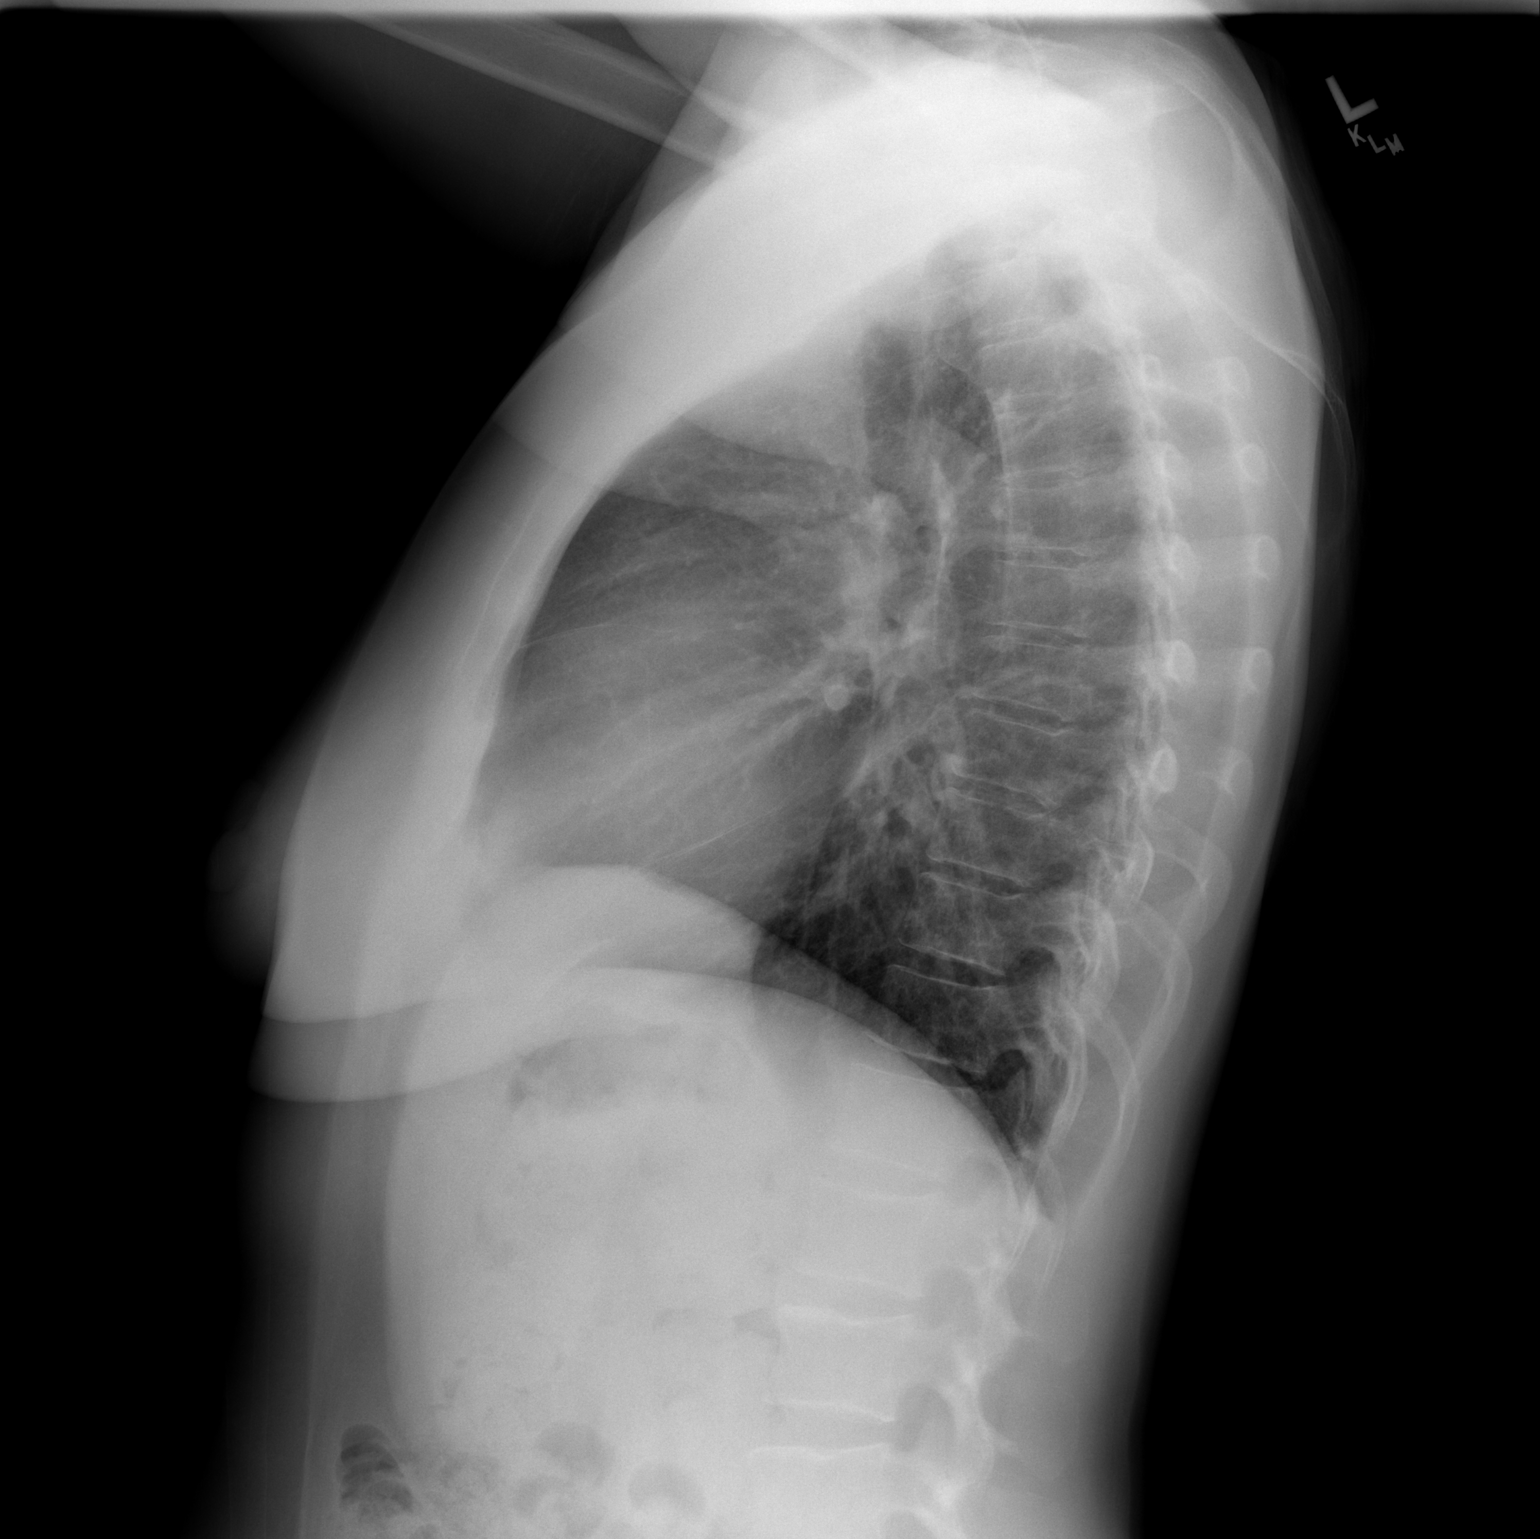

[2 of 2 positions shown; findings below may reference images not displayed]

FINDINGS: The cardiac silhouette, mediastinal and hilar contours
are within normal limits.  The lungs are clear.  The bony thorax is
intact.
IMPRESSION: No acute cardiopulmonary findings.

## 2011-08-26 IMAGING — MR MR PELVIS WO/W CM
7 of 10 series · 32 of 48 positions shown · IV contrast (multihance)
Comparison: Ultrasound 02/12/2011

CLINICAL DATA: Pelvic pain, cystic pelvic mass

MRI PELVIS WITHOUT AND WITH CONTRAST
TECHNIQUE: Multiplanar multisequence MR imaging of the pelvis was
performed both before and after administration of intravenous
contrast.
Contrast: 15 ml Multihance IV contrast

[Series 3: cor haste · coronal · 6.0mm · 0.78mm/px · 4 of 25 slices shown]
[im 1/25]
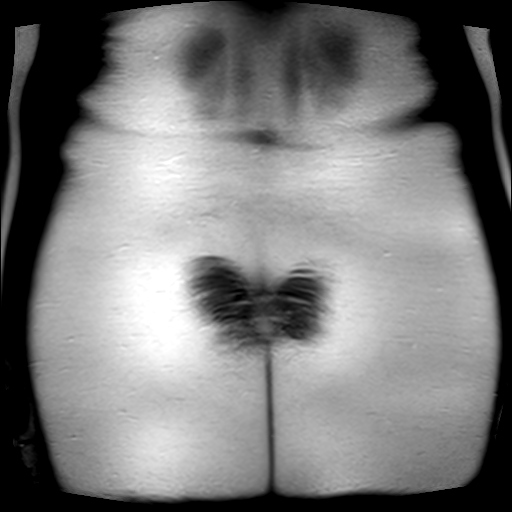
[im 9/25]
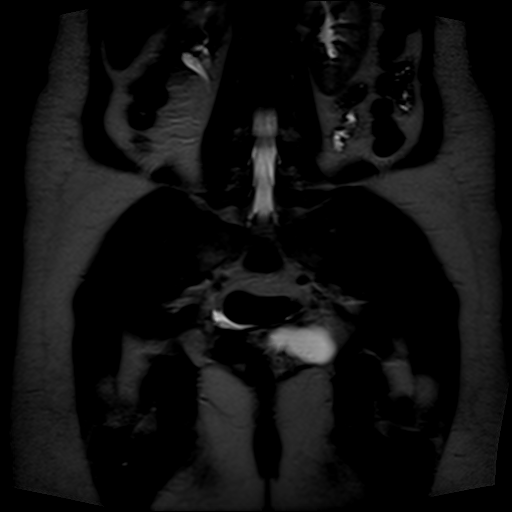
[im 17/25]
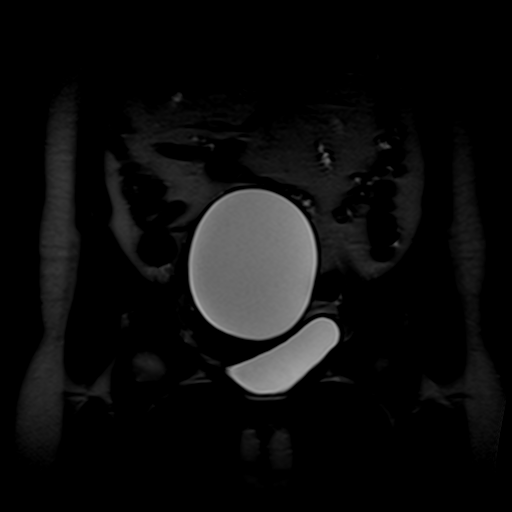
[im 25/25]
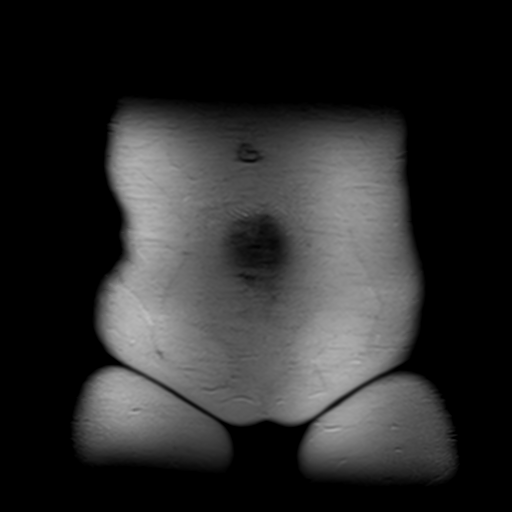

[Series 4: t2_tse_sag · sagittal · 5.0mm · 1.05mm/px · 4 of 27 slices shown]
[im 1/27]
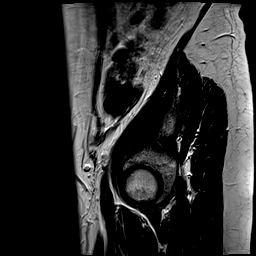
[im 9/27]
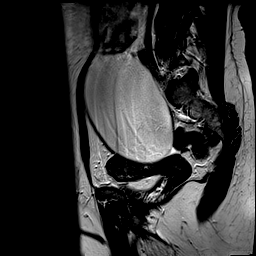
[im 18/27]
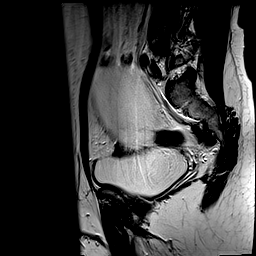
[im 27/27]
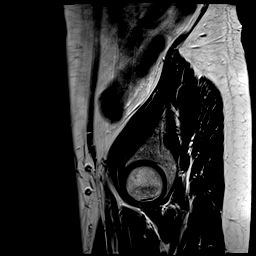

[Series 5: t2_tse axial · axial · 7.0mm · 0.98mm/px · z∈[-104,+124]mm · 5 of 26 slices shown]
[im 1/26]
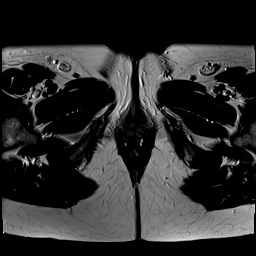
[im 7/26]
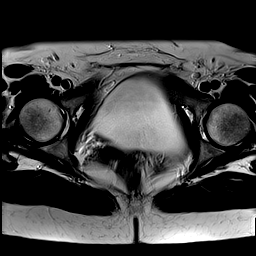
[im 13/26]
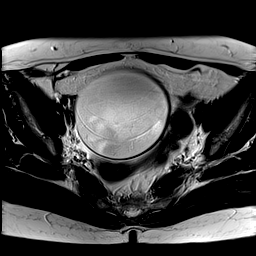
[im 19/26]
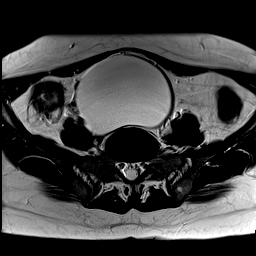
[im 26/26]
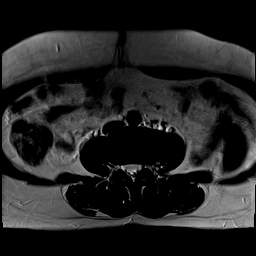

[Series 6: t2_tse axial fs · axial · 7.0mm · 0.98mm/px · z∈[-104,+124]mm · 5 of 26 slices shown]
[im 1/26]
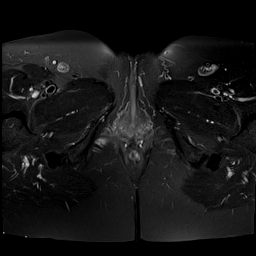
[im 7/26]
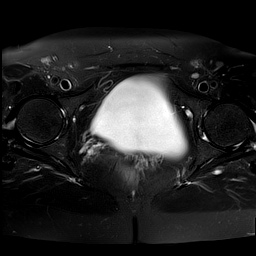
[im 13/26]
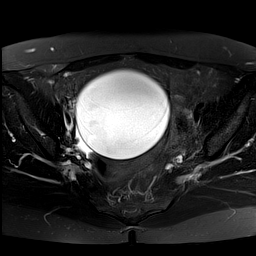
[im 19/26]
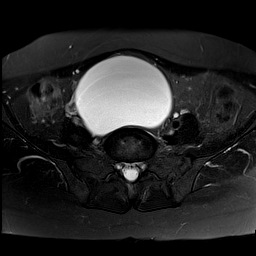
[im 26/26]
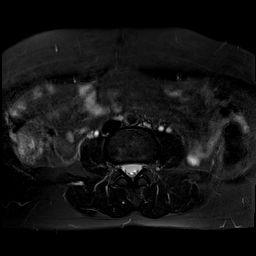

[Series 7: axial spgr · axial · 7.0mm · 0.98mm/px · z∈[-104,+124]mm · 5 of 26 slices shown]
[im 1/26]
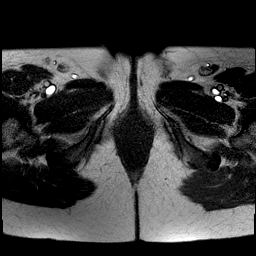
[im 7/26]
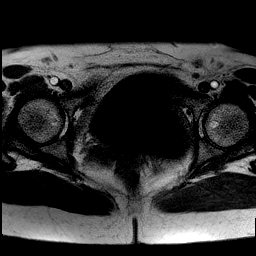
[im 13/26]
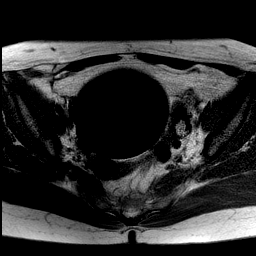
[im 19/26]
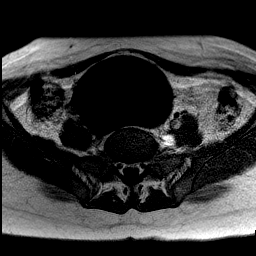
[im 26/26]
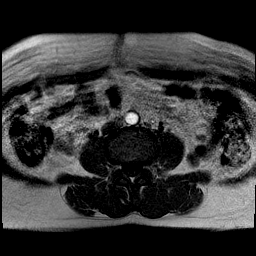

[Series 8: axial spgr pre · axial · non-contrast · 7.0mm · 0.49mm/px · z∈[-104,+124]mm · 5 of 26 slices shown]
[im 1/26]
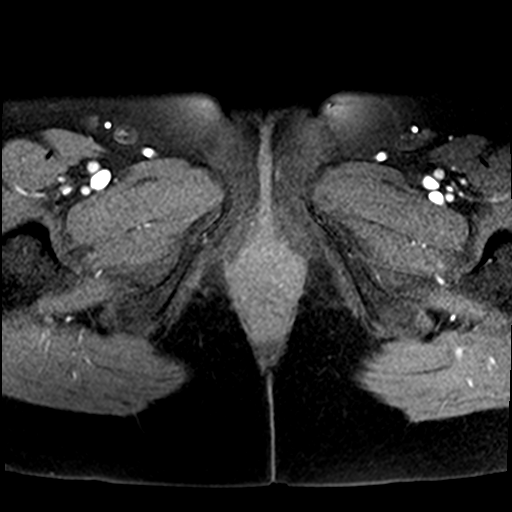
[im 7/26]
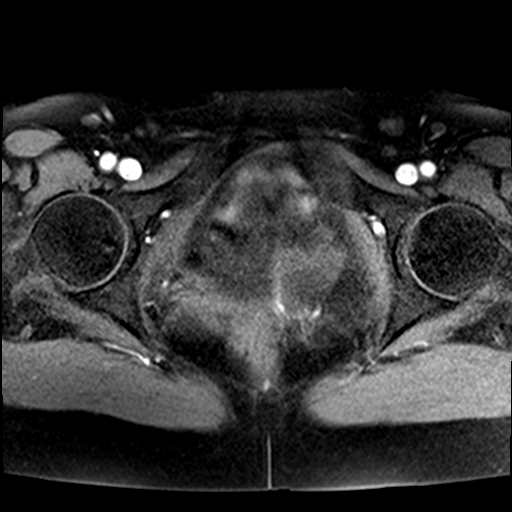
[im 13/26]
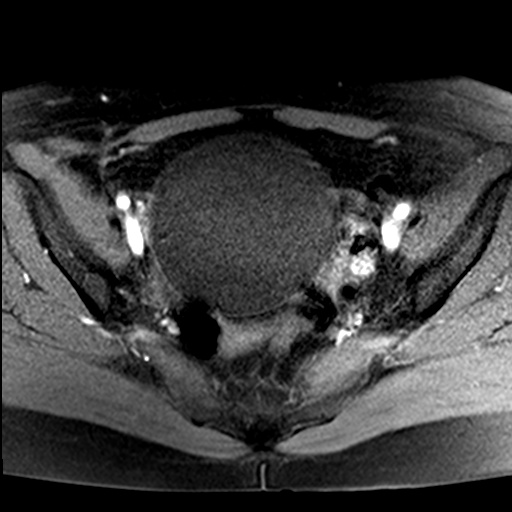
[im 19/26]
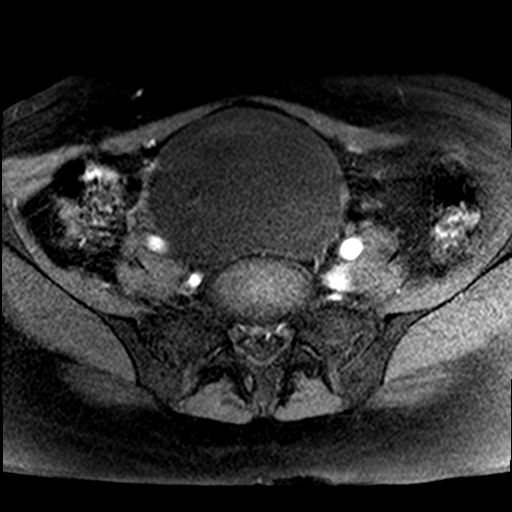
[im 26/26]
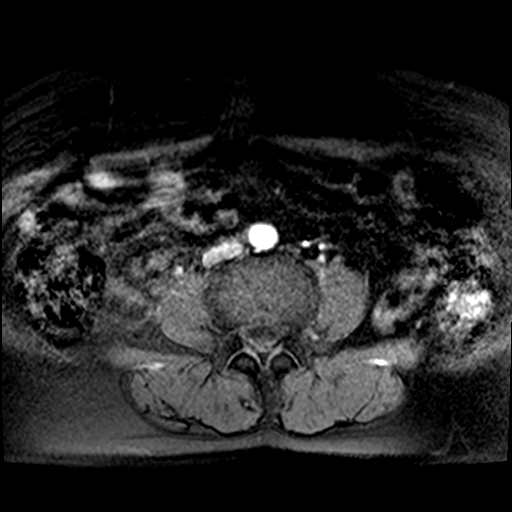

[Series 9: axial spgr post · axial · 7.0mm · 0.49mm/px · z∈[-104,+60]mm · 4 of 26 slices shown]
[im 1/26]
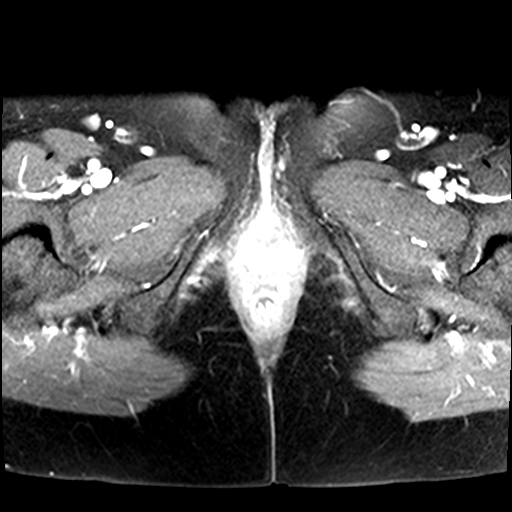
[im 7/26]
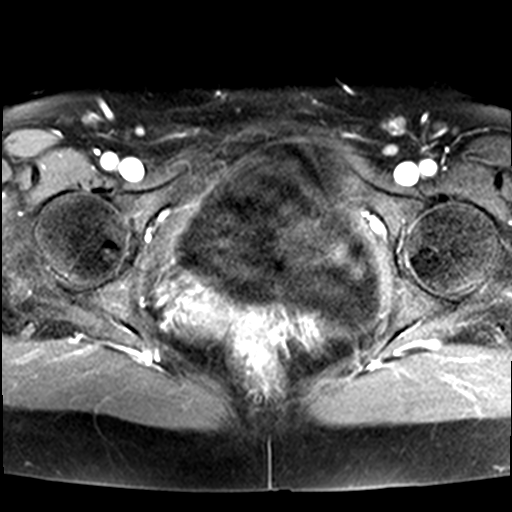
[im 13/26]
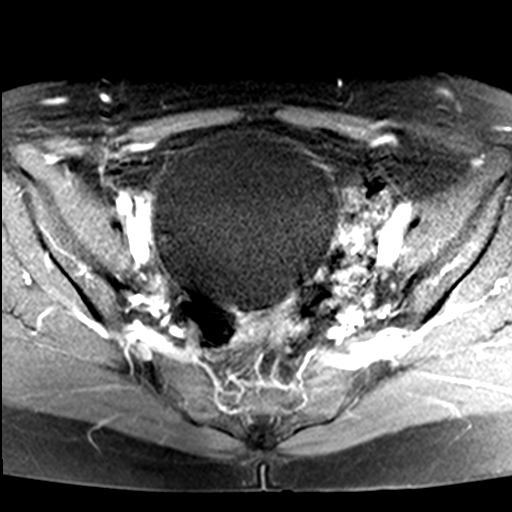
[im 19/26]
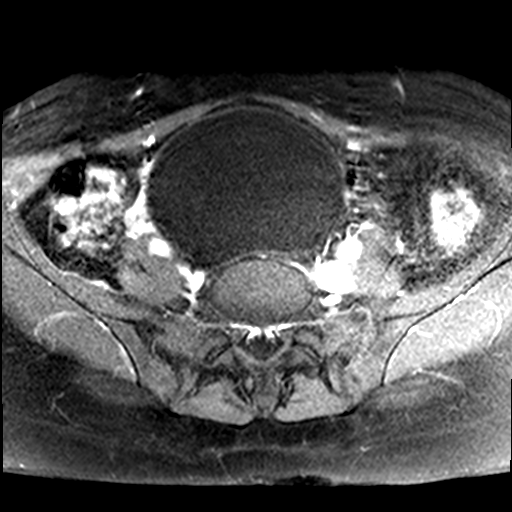

[32 of 48 positions shown; findings below may reference images not displayed]

FINDINGS: In the midline pelvis superior to the uterus and bladder,
there is a homogeneously T2 - hyperintense mass extending nearly to
the level of the umbilicus, measuring 13.6 x 10.3 x 9.7 cm.  This
corresponds to the mass seen on the recent prior exam.  No internal
hemorrhagic, solid, mass, or enhancing component is identified.
There is no adjacent invasion of neural structures.

The uterus measures 9.2 x 3.5 x 2.8 cm and is normal in appearance.
Signal void from probable clip artifact is noted just inferior to
the uterus.

Trace right sided adnexal free fluid is identified. The right ovary
may be visible on image 15, series 5, adjacent to the mass but if
visible is atrophic.  The left ovary is not identified.  No solid
adnexal mass is seen.  The bladder is normal.  No lymphadenopathy.
Osseous structures are normal.

1 cm T2 - hyperintense exophytic right upper renal pole cortical
lesion is incompletely evaluated but statistically most likely a
cyst.
IMPRESSION: Midline cystic pelvic mass, without internal solid component, fatty
tissue, or enhancement.  The primary differential consideration
given its location is a urachal cyst.  These may occasionally
undergo degeneration and formation of urachal carcinoma.  Other
differential considerations could include enteric duplication cyst,
less likely lymphangioma or paraovarian cyst.  The right ovary is
not definitely identified, but it would be unusual for a simple
appearing ovarian cyst to reach this size, and serous cystadenoma
remains within the differential consideration.  Surgical excision
is recommended.

## 2016-02-07 ENCOUNTER — Other Ambulatory Visit: Payer: Self-pay | Admitting: Preventative Medicine

## 2016-02-07 DIAGNOSIS — M25561 Pain in right knee: Secondary | ICD-10-CM

## 2016-02-12 ENCOUNTER — Ambulatory Visit
Admission: RE | Admit: 2016-02-12 | Discharge: 2016-02-12 | Disposition: A | Discharge status: Home / Self Care | Payer: Worker's Compensation | Source: Ambulatory Visit | Attending: Preventative Medicine | Admitting: Preventative Medicine

## 2016-02-12 DIAGNOSIS — M25561 Pain in right knee: Secondary | ICD-10-CM

## 2016-07-21 IMAGING — MR MR KNEE*R* W/O CM
4 of 6 series · 19 of 40 positions shown · non-contrast
Comparison: None.

CLINICAL DATA: Anterior and medial knee pain after twisting injury
1 month ago. No previous relevant surgery. Initial encounter.

EXAM:
MRI OF THE RIGHT KNEE WITHOUT CONTRAST
TECHNIQUE: Multiplanar, multisequence MR imaging of the knee was performed. No
intravenous contrast was administered.

[Series 3: PD fat-sat · axial · 4.0mm · 0.31mm/px · z∈[-20,+90]mm · 7 of 25 slices shown (1 of 4)]
[im 1/25]
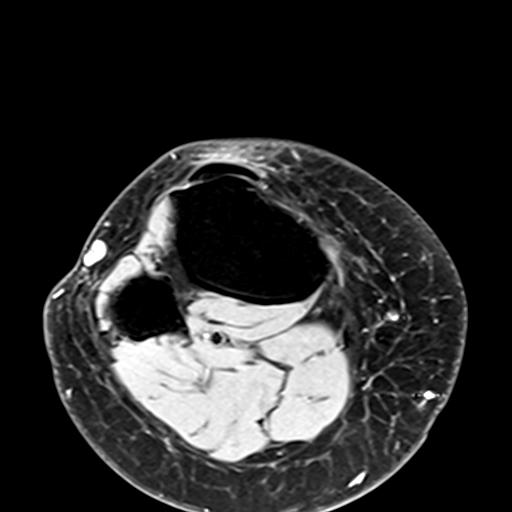
[im 5/25]
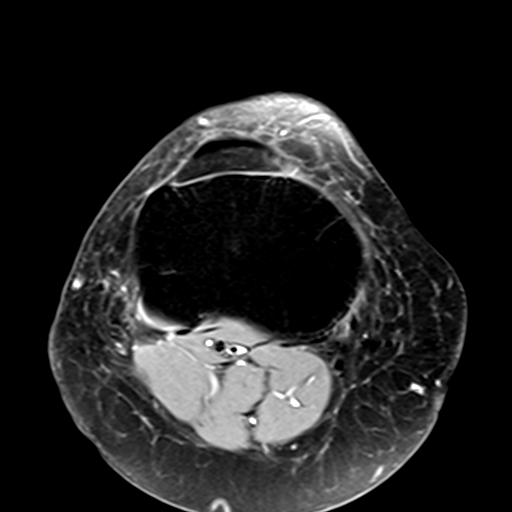
[im 9/25]
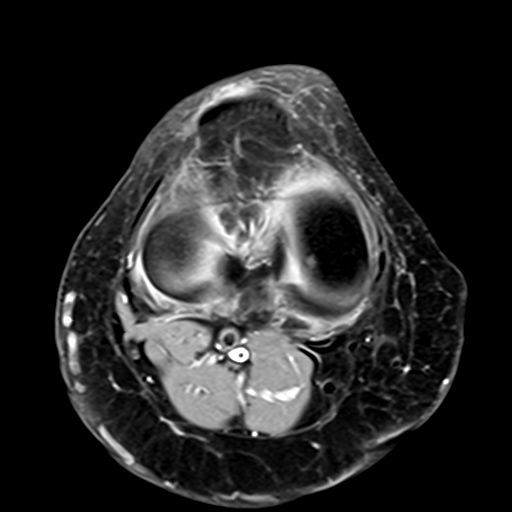
[im 13/25]
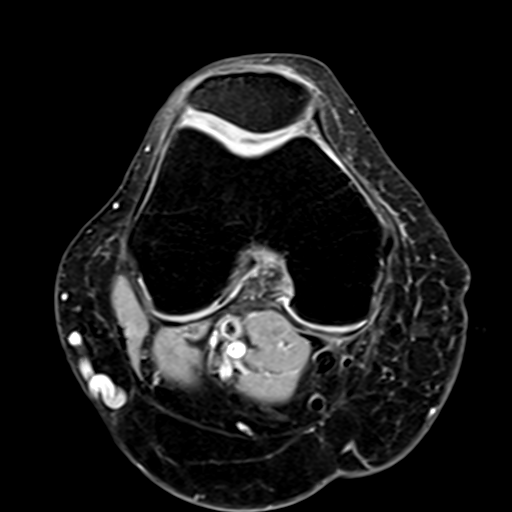
[im 17/25]
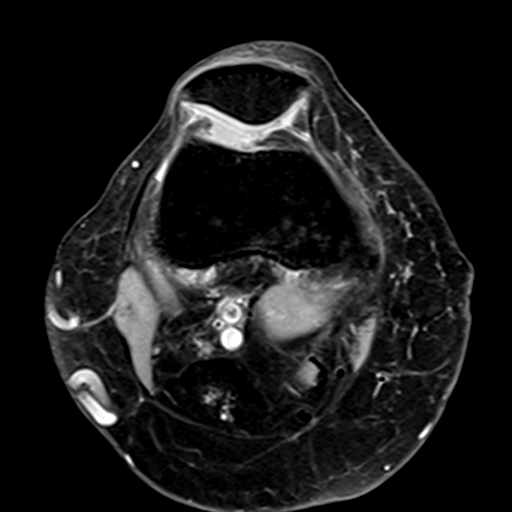
[im 21/25]
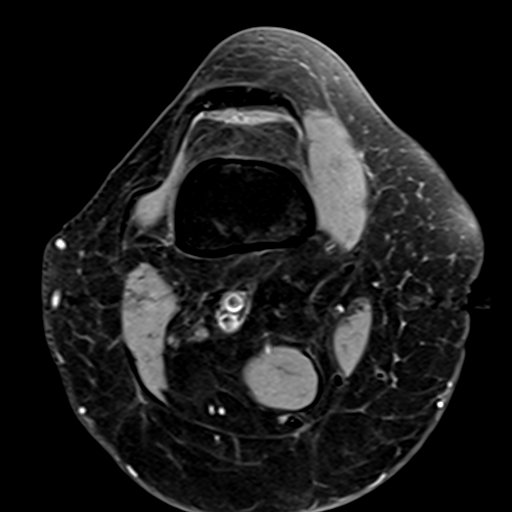
[im 25/25]
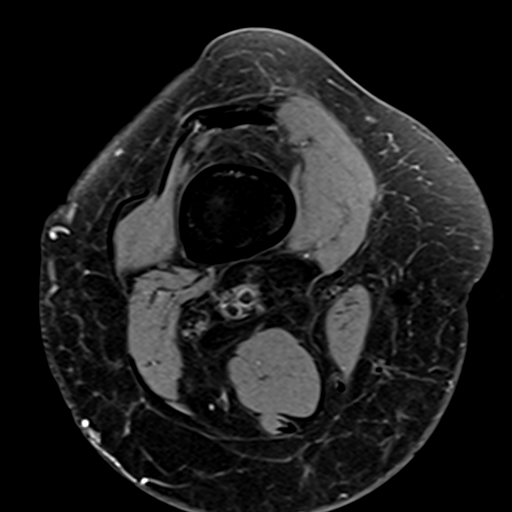

[Series 6: PD fat-sat · sagittal · 3.3mm · 0.31mm/px · 6 of 22 slices shown (2 of 4)]
[im 1/22]
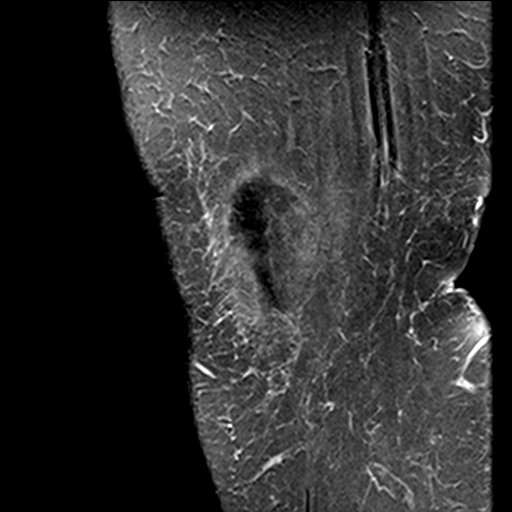
[im 4/22]
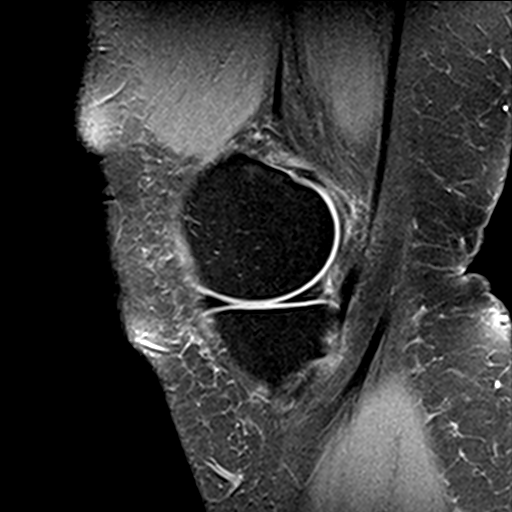
[im 8/22]
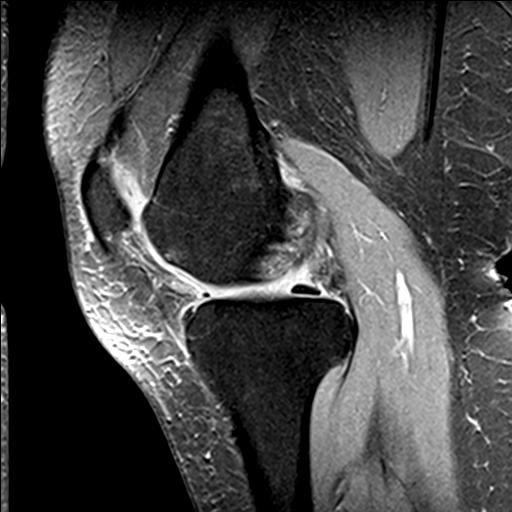
[im 11/22]
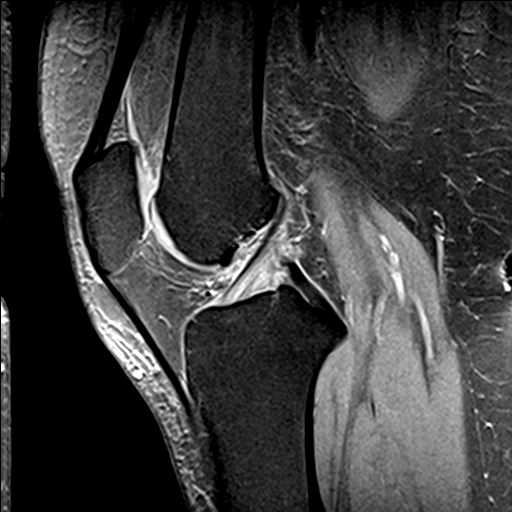
[im 15/22]
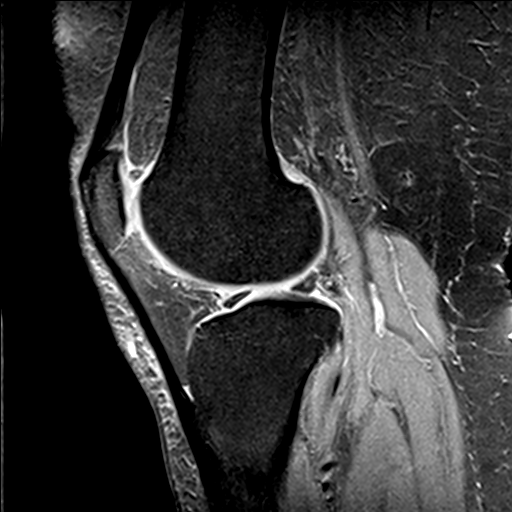
[im 18/22]
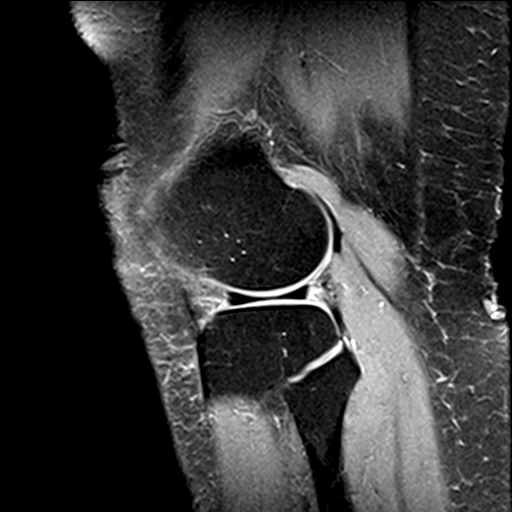

[Series 7: PD fat-sat · coronal · 3.5mm · 0.31mm/px · 3 of 23 slices shown (3 of 4)]
[im 4/23]
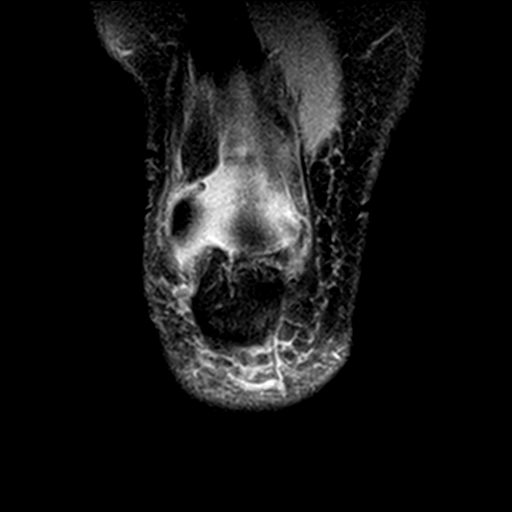
[im 12/23]
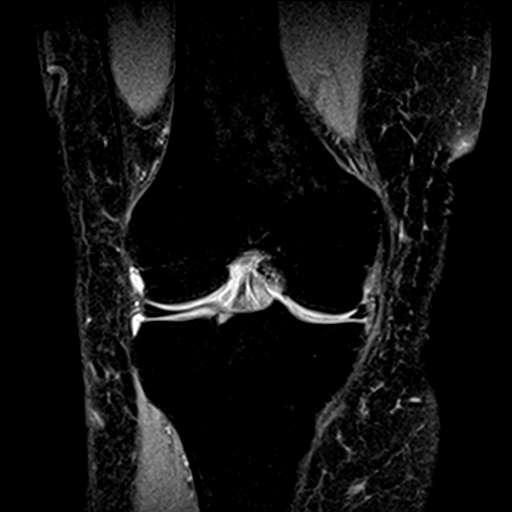
[im 19/23]
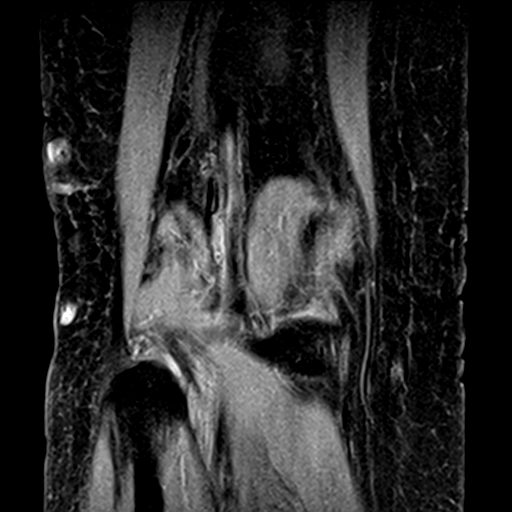

[Series 8: PD fat-sat · coronal · 2.0mm · 0.29mm/px · 3 of 15 slices shown (4 of 4)]
[im 1/15]
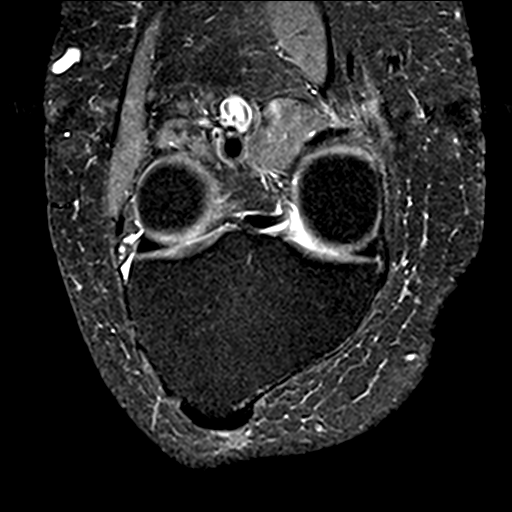
[im 8/15]
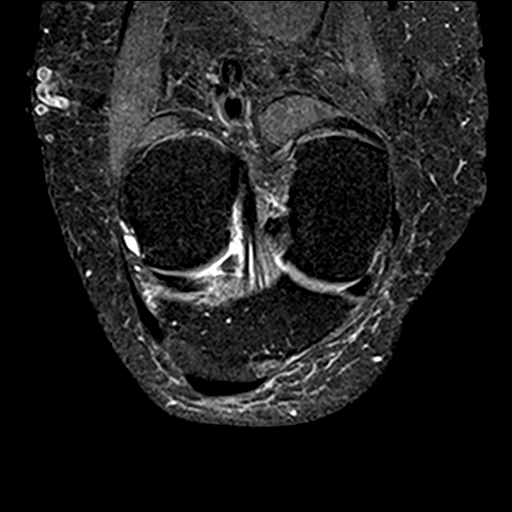
[im 15/15]
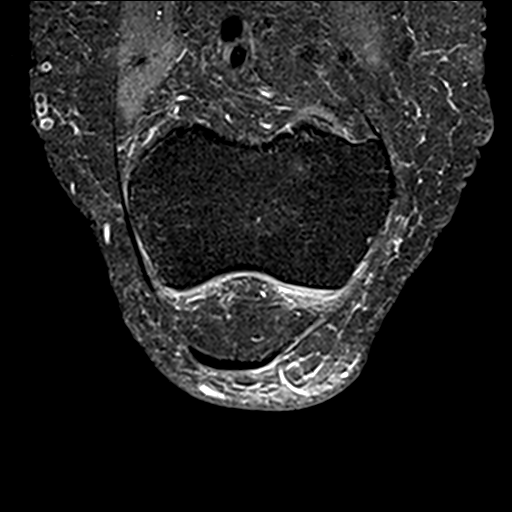

[19 of 40 positions shown; findings below may reference images not displayed]

FINDINGS: MENISCI

Medial meniscus:  Intact with normal morphology.

Lateral meniscus:  Intact with normal morphology.

LIGAMENTS

Cruciates:  Intact.

Collaterals:  Intact.

CARTILAGE

Patellofemoral: The chondral thickness appears relatively preserved.
There is mild subchondral cyst formation at the patellar apex and
within the medial femoral trochlea.

Medial:  Minimal chondral thinning without focal defect.

Lateral:  Preserved.

OTHER

Joint:  No significant joint effusion.

Popliteal Fossa: No significant Baker's cyst. There are superficial
varicosities posterolaterally.

Extensor Mechanism:  Intact.  Mild prepatellar subcutaneous edema.

Bones:  No acute or significant extra-articular osseous findings.
IMPRESSION: 1. No acute findings or evidence of internal derangement.
2. Mild patellofemoral and medial compartment degenerative changes.
3. Superficial varicosities posterolaterally.

## 2017-04-23 ENCOUNTER — Encounter: Payer: Self-pay | Admitting: Gastroenterology

## 2017-04-29 ENCOUNTER — Encounter: Payer: Self-pay | Admitting: Gastroenterology

## 2017-04-29 ENCOUNTER — Ambulatory Visit (INDEPENDENT_AMBULATORY_CARE_PROVIDER_SITE_OTHER): Payer: Self-pay | Admitting: Gastroenterology

## 2017-04-29 VITALS — BP 120/70 | HR 68 | Ht 62.0 in | Wt 165.4 lb

## 2017-04-29 DIAGNOSIS — D509 Iron deficiency anemia, unspecified: Secondary | ICD-10-CM

## 2017-04-29 DIAGNOSIS — R1011 Right upper quadrant pain: Secondary | ICD-10-CM

## 2017-04-29 DIAGNOSIS — R195 Other fecal abnormalities: Secondary | ICD-10-CM

## 2017-04-29 NOTE — Progress Notes (Signed)
[facility] Consult Note:  History: Kathleen Watson 04/29/2017  Referring physician: Concha Watson ([place], [place] [place])  Reason for consult/chief complaint: Abdominal Pain (Pain for 15 days, RUQ) and Rectal Bleeding (Positive hemocult)   Subjective  HPI:  This is a 56 year old woman referred by her local health clinic for right upper quadrant pain, iron deficiency anemia and heme positive stool. She was seen with the aid of the Spanish interpreter today. Kathleen Watson reports just over 2 weeks of intermittent right upper quadrant pain that is nonradiating with no clear triggers or relieving factors. He feels it is reminiscent of her gallbladder symptoms for many years ago. She had not had any problems in that area since her cholecystectomy. She denies nausea, vomiting, early satiety, dysphagia or weight loss. Her bowel habits are regular with no rectal bleeding. She reports having been anemic intermittently for years, we have no prior records other than the recent office visit and labs. She took a daily aspirin for years until she stopped about a year ago. It seems she was on some recent ibuprofen for this right upper quadrant pain but since stopped that.   ROS:  Review of Systems  Constitutional: Negative for appetite change and unexpected weight change.  HENT: Negative for mouth sores and voice change.   Eyes: Negative for pain and redness.  Respiratory: Negative for cough and shortness of breath.   Cardiovascular: Negative for chest pain and palpitations.  Genitourinary: Negative for dysuria and hematuria.  Musculoskeletal: Negative for arthralgias and myalgias.  Skin: Negative for pallor and rash.  Neurological: Negative for weakness and headaches.  Hematological: Negative for adenopathy.     Past Medical History: Past Medical History:  Diagnosis Date  . Anemia      Past Surgical History: Past Surgical History:  Procedure  Laterality Date  . BILATERAL SALPINGOOPHORECTOMY  7.9.12   EXP.LAP/PELVIC WASHING/Benign Serous Cystadenoma (R)  . CESAREAN SECTION     times three  . CHOLECYSTECTOMY    . LAPAROTOMY  03/31/2011   Procedure: LAPAROTOMY;  Surgeon: Terrance Mass, MD;  Location: [facility];  Service: Gynecology;  Laterality: N/A;  . SALPINGOOPHORECTOMY  03/31/2011   Procedure: SALPINGO OOPHERECTOMY;  Surgeon: Terrance Mass, MD;  Location: [facility];  Service: Gynecology;  Laterality: Bilateral;     Family History: History reviewed. No pertinent family history.  Social History: Social History   Social History  . Marital status: Single    Spouse name: N/A  . Number of children: 3  . Years of education: N/A   Social History Main Topics  . Smoking status: Never Smoker  . Smokeless tobacco: Never Used  . Alcohol use No  . Drug use: No  . Sexual activity: Not Currently    Birth control/ protection: Abstinence, Post-menopausal   Other Topics Concern  . None   Social History Narrative  . None    Allergies: No Known Allergies  Outpatient Meds: No current outpatient prescriptions on file.   No current facility-administered medications for this visit.       ___________________________________________________________________ Objective   Exam:  BP 120/70   Pulse 68   Ht 5\' 2"  (1.575 m)   Wt 165 lb 6.4 oz (75 kg)   BMI 30.25 kg/m    General: this is a(n) Well-appearing middle-aged woman   Eyes: sclera anicteric, no redness  ENT: oral mucosa moist without lesions, no cervical or supraclavicular lymphadenopathy, good dentition  CV: RRR without murmur, S1/S2, no JVD, no  peripheral edema  Resp: clear to auscultation bilaterally, normal RR and effort noted  GI: soft, mild RUQ tenderness, with active bowel sounds. No guarding or palpable organomegaly noted.  Skin; warm and dry, no rash or jaundice noted  Neuro: awake, alert and oriented x 3. Normal gross motor function and fluent  speech Female interpreter present for the entire encounter Labs:  04/15/17: 3 stool cards reportedly positive for occult blood Ferritin 241 iron 66 TIBC 294 (225 sat) H. pylori IgG antibody negative lipase 32 CMP normal Hemoglobin 9.7 and MCV 64 platelets 288 WBC 6.0  Radiologic Studies:  It appears that her right upper quadrant ultrasound was done but we do not have the report. The patient was told she had a fatty liver.  Assessment: Encounter Diagnoses  Name Primary?  . RUQ pain Yes  . Iron deficiency anemia, unspecified iron deficiency anemia type   . Heme positive stool     2 weeks of right upper quadrant pain with normal LFTs, which speaks somewhat against retained CBD stone. It is difficult to tell how or if this microcytic anemia is related to the symptoms, and we do not know its chronicity.  Plan:  EGD and colonoscopy She was given information for [place] financial services since she is currently uninsured.  Thank you for the courtesy of this consult.  Please call me with any questions or concerns.  Kathleen Watson  CC: PCP noted above

## 2017-04-29 NOTE — Patient Instructions (Signed)
It has been recommended to you by your physician that you have a endoscopy/colonoscopy completed. Per your request, we did not schedule the procedure(s) today. Please contact our office at [phone] should you decide to have the procedure completed.  If you are age 58 or older, your body mass index should be between 23-30. Your Body mass index is 30.25 kg/m. If this is out of the aforementioned range listed, please consider follow up with your Primary Care Provider.  If you are age 45 or younger, your body mass index should be between 19-25. Your Body mass index is 30.25 kg/m. If this is out of the aformentioned range listed, please consider follow up with your Primary Care Provider.   Thank you for choosing [facility]  Dr Wilfrid Lund III

## 2017-05-20 ENCOUNTER — Ambulatory Visit: Payer: Self-pay | Attending: Internal Medicine

## 2017-06-04 ENCOUNTER — Telehealth: Payer: Self-pay | Admitting: Physician Assistant

## 2017-06-04 NOTE — Telephone Encounter (Signed)
I used an interpreter to leave a message for this patient to call me.  She applied for the [facility] discount and I need three bank statements.  Pt brought the wrong documentation to our office.  I also mailed her a letter requesting three bank  Statements.

## 2017-06-09 ENCOUNTER — Telehealth: Payer: Self-pay

## 2017-06-09 NOTE — Telephone Encounter (Signed)
-----   Message from Roetta Sessions, [place] sent at 04/29/2017  9:39 AM EDT ----- Regarding: schedule endo/colon Pt in clinic on 8-8.  Uninsured; needs egd/colon; gave orange card paperwork.

## 2017-06-09 NOTE — Telephone Encounter (Signed)
Pt was seen in clinic on 04-29-2017. Per Dr. Loletha Carrow he wanted pt to have endoscopic procedures done. She was given information on how to apply for the orange card. She has not yet finished that process. Jamelle Haring has tried to reach her multiple times and can not reach her. She has also not returned any calls. Will check back on her in a few weeks. Reminder has been sent to myself to follow up.

## 2017-07-13 NOTE — Telephone Encounter (Signed)
I am prepared to schedule and perform the procedures when any authorization from [place] services is received.  Please check the prior authorization policy from [facility] [employer] [facility] regarding uninsured patients. I expect the patient must at least make the initial inquiry and complete paperwork.

## 2017-07-13 NOTE — Telephone Encounter (Signed)
Pt has not returned calls and looks like she has still not completed the work up for the [facility] orange card.

## 2017-07-17 NOTE — Telephone Encounter (Signed)
Kathleen Watson will try and contact the patient again as she is spanish speaking. Kathleen Watson has tried multiple times to reach her and had no luck or return call. Per the front staff pt has to turn in her tax form info to complete the orange health card approval process.

## 2017-07-31 NOTE — Telephone Encounter (Signed)
Thank you for your diligence checking in on her.  We will have to wait until we hear from her to schedule procedures.

## 2017-07-31 NOTE — Telephone Encounter (Signed)
Kathleen Watson has made contact with the patient today. She declines to schedule at this time. She states she has no time and doesn't want to miss work, Kathleen Watson explained that if she turns in the documents required she could get the [facility] financial help. She still at this time declines to proceed. She was advised to call when she is ready.
